# Patient Record
Sex: Female | Born: 1950
Health system: Southern US, Community
[De-identification: ages and names within clinical notes are randomized; demographics above are authoritative.]

## PROBLEM LIST (undated history)

## (undated) DIAGNOSIS — E785 Hyperlipidemia, unspecified: Secondary | ICD-10-CM

## (undated) DIAGNOSIS — E079 Disorder of thyroid, unspecified: Secondary | ICD-10-CM

## (undated) DIAGNOSIS — K219 Gastro-esophageal reflux disease without esophagitis: Secondary | ICD-10-CM

## (undated) DIAGNOSIS — E039 Hypothyroidism, unspecified: Secondary | ICD-10-CM

## (undated) DIAGNOSIS — Z8719 Personal history of other diseases of the digestive system: Secondary | ICD-10-CM

## (undated) DIAGNOSIS — M8580 Other specified disorders of bone density and structure, unspecified site: Secondary | ICD-10-CM

## (undated) HISTORY — DX: Personal history of other diseases of the digestive system: Z87.19

## (undated) HISTORY — DX: Other specified disorders of bone density and structure, unspecified site: M85.80

## (undated) HISTORY — DX: Gastro-esophageal reflux disease without esophagitis: K21.9

## (undated) HISTORY — DX: Disorder of thyroid, unspecified: E07.9

## (undated) HISTORY — DX: Hypothyroidism, unspecified: E03.9

## (undated) HISTORY — DX: Hyperlipidemia, unspecified: E78.5

---

## 1999-01-05 ENCOUNTER — Other Ambulatory Visit: Admission: RE | Admit: 1999-01-05 | Discharge: 1999-01-05 | Payer: Self-pay | Admitting: Obstetrics and Gynecology

## 2000-02-16 ENCOUNTER — Other Ambulatory Visit: Admission: RE | Admit: 2000-02-16 | Discharge: 2000-02-16 | Payer: Self-pay | Admitting: Obstetrics and Gynecology

## 2001-03-14 ENCOUNTER — Other Ambulatory Visit: Admission: RE | Admit: 2001-03-14 | Discharge: 2001-03-14 | Payer: Self-pay | Admitting: Obstetrics and Gynecology

## 2002-04-22 ENCOUNTER — Other Ambulatory Visit: Admission: RE | Admit: 2002-04-22 | Discharge: 2002-04-22 | Payer: Self-pay | Admitting: Obstetrics and Gynecology

## 2003-04-28 ENCOUNTER — Other Ambulatory Visit: Admission: RE | Admit: 2003-04-28 | Discharge: 2003-04-28 | Payer: Self-pay | Admitting: Obstetrics and Gynecology

## 2004-05-17 ENCOUNTER — Other Ambulatory Visit: Admission: RE | Admit: 2004-05-17 | Discharge: 2004-05-17 | Payer: Self-pay | Admitting: Obstetrics and Gynecology

## 2005-05-25 ENCOUNTER — Other Ambulatory Visit: Admission: RE | Admit: 2005-05-25 | Discharge: 2005-05-25 | Payer: Self-pay | Admitting: Obstetrics and Gynecology

## 2009-11-04 ENCOUNTER — Encounter: Admission: RE | Admit: 2009-11-04 | Discharge: 2009-11-04 | Payer: Self-pay | Admitting: Obstetrics and Gynecology

## 2010-03-31 ENCOUNTER — Ambulatory Visit (HOSPITAL_COMMUNITY): Payer: Self-pay | Admitting: Psychology

## 2012-05-08 ENCOUNTER — Other Ambulatory Visit: Payer: Self-pay | Admitting: Obstetrics and Gynecology

## 2012-05-08 DIAGNOSIS — R928 Other abnormal and inconclusive findings on diagnostic imaging of breast: Secondary | ICD-10-CM

## 2012-05-15 ENCOUNTER — Other Ambulatory Visit: Payer: Self-pay | Admitting: Obstetrics and Gynecology

## 2012-05-15 ENCOUNTER — Ambulatory Visit
Admission: RE | Admit: 2012-05-15 | Discharge: 2012-05-15 | Disposition: A | Discharge status: Home / Self Care | Payer: 59 | Source: Ambulatory Visit | Attending: Obstetrics and Gynecology | Admitting: Obstetrics and Gynecology

## 2012-05-15 DIAGNOSIS — R928 Other abnormal and inconclusive findings on diagnostic imaging of breast: Secondary | ICD-10-CM

## 2012-05-23 ENCOUNTER — Ambulatory Visit
Admission: RE | Admit: 2012-05-23 | Discharge: 2012-05-23 | Disposition: A | Discharge status: Home / Self Care | Payer: 59 | Source: Ambulatory Visit | Attending: Obstetrics and Gynecology | Admitting: Obstetrics and Gynecology

## 2012-05-23 ENCOUNTER — Other Ambulatory Visit: Payer: Self-pay | Admitting: Obstetrics and Gynecology

## 2012-05-23 DIAGNOSIS — R928 Other abnormal and inconclusive findings on diagnostic imaging of breast: Secondary | ICD-10-CM

## 2012-05-29 ENCOUNTER — Inpatient Hospital Stay: Admission: RE | Admit: 2012-05-29 | Payer: 59 | Source: Ambulatory Visit

## 2012-12-06 ENCOUNTER — Other Ambulatory Visit: Payer: Self-pay | Admitting: Obstetrics and Gynecology

## 2012-12-06 DIAGNOSIS — R921 Mammographic calcification found on diagnostic imaging of breast: Secondary | ICD-10-CM

## 2012-12-23 ENCOUNTER — Ambulatory Visit
Admission: RE | Admit: 2012-12-23 | Discharge: 2012-12-23 | Disposition: A | Discharge status: Home / Self Care | Payer: BC Managed Care – PPO | Source: Ambulatory Visit | Attending: Obstetrics and Gynecology | Admitting: Obstetrics and Gynecology

## 2012-12-23 DIAGNOSIS — R921 Mammographic calcification found on diagnostic imaging of breast: Secondary | ICD-10-CM

## 2013-05-06 ENCOUNTER — Other Ambulatory Visit: Payer: Self-pay | Admitting: Obstetrics and Gynecology

## 2013-05-12 ENCOUNTER — Other Ambulatory Visit: Payer: Self-pay | Admitting: Obstetrics and Gynecology

## 2013-05-12 DIAGNOSIS — R928 Other abnormal and inconclusive findings on diagnostic imaging of breast: Secondary | ICD-10-CM

## 2013-05-19 ENCOUNTER — Ambulatory Visit
Admission: RE | Admit: 2013-05-19 | Discharge: 2013-05-19 | Disposition: A | Discharge status: Home / Self Care | Payer: BC Managed Care – PPO | Source: Ambulatory Visit | Attending: Obstetrics and Gynecology | Admitting: Obstetrics and Gynecology

## 2013-05-19 DIAGNOSIS — R928 Other abnormal and inconclusive findings on diagnostic imaging of breast: Secondary | ICD-10-CM

## 2014-05-12 ENCOUNTER — Other Ambulatory Visit: Payer: Self-pay | Admitting: Obstetrics and Gynecology

## 2014-05-18 LAB — CYTOLOGY - PAP

## 2014-05-22 ENCOUNTER — Other Ambulatory Visit: Payer: Self-pay | Admitting: Obstetrics and Gynecology

## 2014-05-22 DIAGNOSIS — E2839 Other primary ovarian failure: Secondary | ICD-10-CM

## 2014-05-26 ENCOUNTER — Ambulatory Visit
Admission: RE | Admit: 2014-05-26 | Discharge: 2014-05-26 | Disposition: A | Discharge status: Home / Self Care | Payer: BC Managed Care – PPO | Source: Ambulatory Visit | Attending: Obstetrics and Gynecology | Admitting: Obstetrics and Gynecology

## 2014-05-26 DIAGNOSIS — E2839 Other primary ovarian failure: Secondary | ICD-10-CM

## 2014-05-26 LAB — HM DEXA SCAN

## 2014-05-27 ENCOUNTER — Other Ambulatory Visit: Payer: BC Managed Care – PPO

## 2014-08-03 ENCOUNTER — Ambulatory Visit (INDEPENDENT_AMBULATORY_CARE_PROVIDER_SITE_OTHER): Payer: 59 | Admitting: Physician Assistant

## 2014-08-03 ENCOUNTER — Encounter: Payer: Self-pay | Admitting: Physician Assistant

## 2014-08-03 VITALS — BP 127/68 | HR 78 | Temp 97.7°F | Ht 63.0 in | Wt 146.0 lb

## 2014-08-03 DIAGNOSIS — M810 Age-related osteoporosis without current pathological fracture: Secondary | ICD-10-CM

## 2014-08-03 DIAGNOSIS — E039 Hypothyroidism, unspecified: Secondary | ICD-10-CM | POA: Diagnosis not present

## 2014-08-03 DIAGNOSIS — H6981 Other specified disorders of Eustachian tube, right ear: Secondary | ICD-10-CM | POA: Diagnosis not present

## 2014-08-03 DIAGNOSIS — K219 Gastro-esophageal reflux disease without esophagitis: Secondary | ICD-10-CM

## 2014-08-03 MED ORDER — METHYLPREDNISOLONE (PAK) 4 MG PO TABS: ORAL_TABLET | ORAL | Status: DC

## 2014-08-03 NOTE — Patient Instructions (Signed)
flonase 2 sprays each nostril daily

## 2014-08-03 NOTE — Progress Notes (Signed)
   Subjective:    Patient ID: Tammy Humphrey, female    DOB: 1951/06/18, 64 y.o.   MRN: [id number]  HPI Pt is a 64 yo female who presents to the clinic to establish care.   .. Active Ambulatory Problems    Diagnosis Date Noted  . Esophageal reflux 08/03/2014  . Thyroid activity decreased 08/03/2014  . Osteoporosis 08/03/2014   Resolved Ambulatory Problems    Diagnosis Date Noted  . No Resolved Ambulatory Problems   Past Medical History  Diagnosis Date  . Thyroid disease    .Marland Kitchen Family History  Problem Relation Age of Onset  . Hypertension Mother   . Heart attack Father   . Stroke Father   . Heart attack Paternal Grandmother    .Marland Kitchen History   Social History  . Marital Status: Widowed    Spouse Name: N/A  . Number of Children: N/A  . Years of Education: N/A   Occupational History  . Not on file.   Social History Main Topics  . Smoking status: Never Smoker   . Smokeless tobacco: Not on file  . Alcohol Use: No  . Drug Use: No  . Sexual Activity: Not Currently   Other Topics Concern  . Not on file   Social History Narrative  . No narrative on file   Pt has had a cold and symptoms seemed to be improving except for right ear stuffiness and discomfort. While she had cold symptoms she tried mucinex and helped with some symptoms but not ear discomfort. No drainage. No fever, sinus pressure, cough, ST.    Review of Systems  All other systems reviewed and are negative.      Objective:   Physical Exam  Constitutional: She is oriented to person, place, and time. She appears well-developed and well-nourished.  HENT:  Head: Normocephalic and atraumatic.  Left Ear: External ear normal.  Nose: Nose normal.  Mouth/Throat: Oropharynx is clear and moist. No oropharyngeal exudate.  Right TM clear no signs of infection. Possible slight buldge due to fluid build up.   No sinus pressure to palpation.   Eyes: Conjunctivae are normal. Right eye exhibits no discharge. Left  eye exhibits no discharge.  Neck: Normal range of motion. Neck supple.  Cardiovascular: Normal rate, regular rhythm and normal heart sounds.   Pulmonary/Chest: Effort normal and breath sounds normal. She has no wheezes.  Lymphadenopathy:    She has no cervical adenopathy.  Neurological: She is alert and oriented to person, place, and time.  Skin: Skin is dry.  Psychiatric: She has a normal mood and affect. Her behavior is normal.          Assessment & Plan:  Eustachian ear dysfunction- treated with prednisone and flonase. Chew gum can help to decrease pressure. Follow up as needed. Reassured pt no signs of infection today.   Osteoporosis- on evista. Will get records on last bone density.   Thyroid activity decreased- on medication last labs in November.   GERD- controlled with protonix.

## 2014-08-07 ENCOUNTER — Encounter: Payer: Self-pay | Admitting: Physician Assistant

## 2014-08-07 DIAGNOSIS — D509 Iron deficiency anemia, unspecified: Secondary | ICD-10-CM | POA: Insufficient documentation

## 2014-08-07 DIAGNOSIS — E785 Hyperlipidemia, unspecified: Secondary | ICD-10-CM | POA: Insufficient documentation

## 2014-09-19 IMAGING — MG MM DIAGNOSTIC UNILATERAL R
3 series · 3 of 7 positions shown · non-contrast
Comparison: 05/23/2012, 05/15/2012 from [facility] [[facility]ED].

CLINICAL DATA: The patient returns for evaluation of a possible
area of distortion in the right subareolar region, noted on recent
screening mammogram from [HOSPITAL[facility] dated 05/06/2013.

EXAM:
DIGITAL DIAGNOSTIC  RIGHT MAMMOGRAM WITH CAD

[R CC]
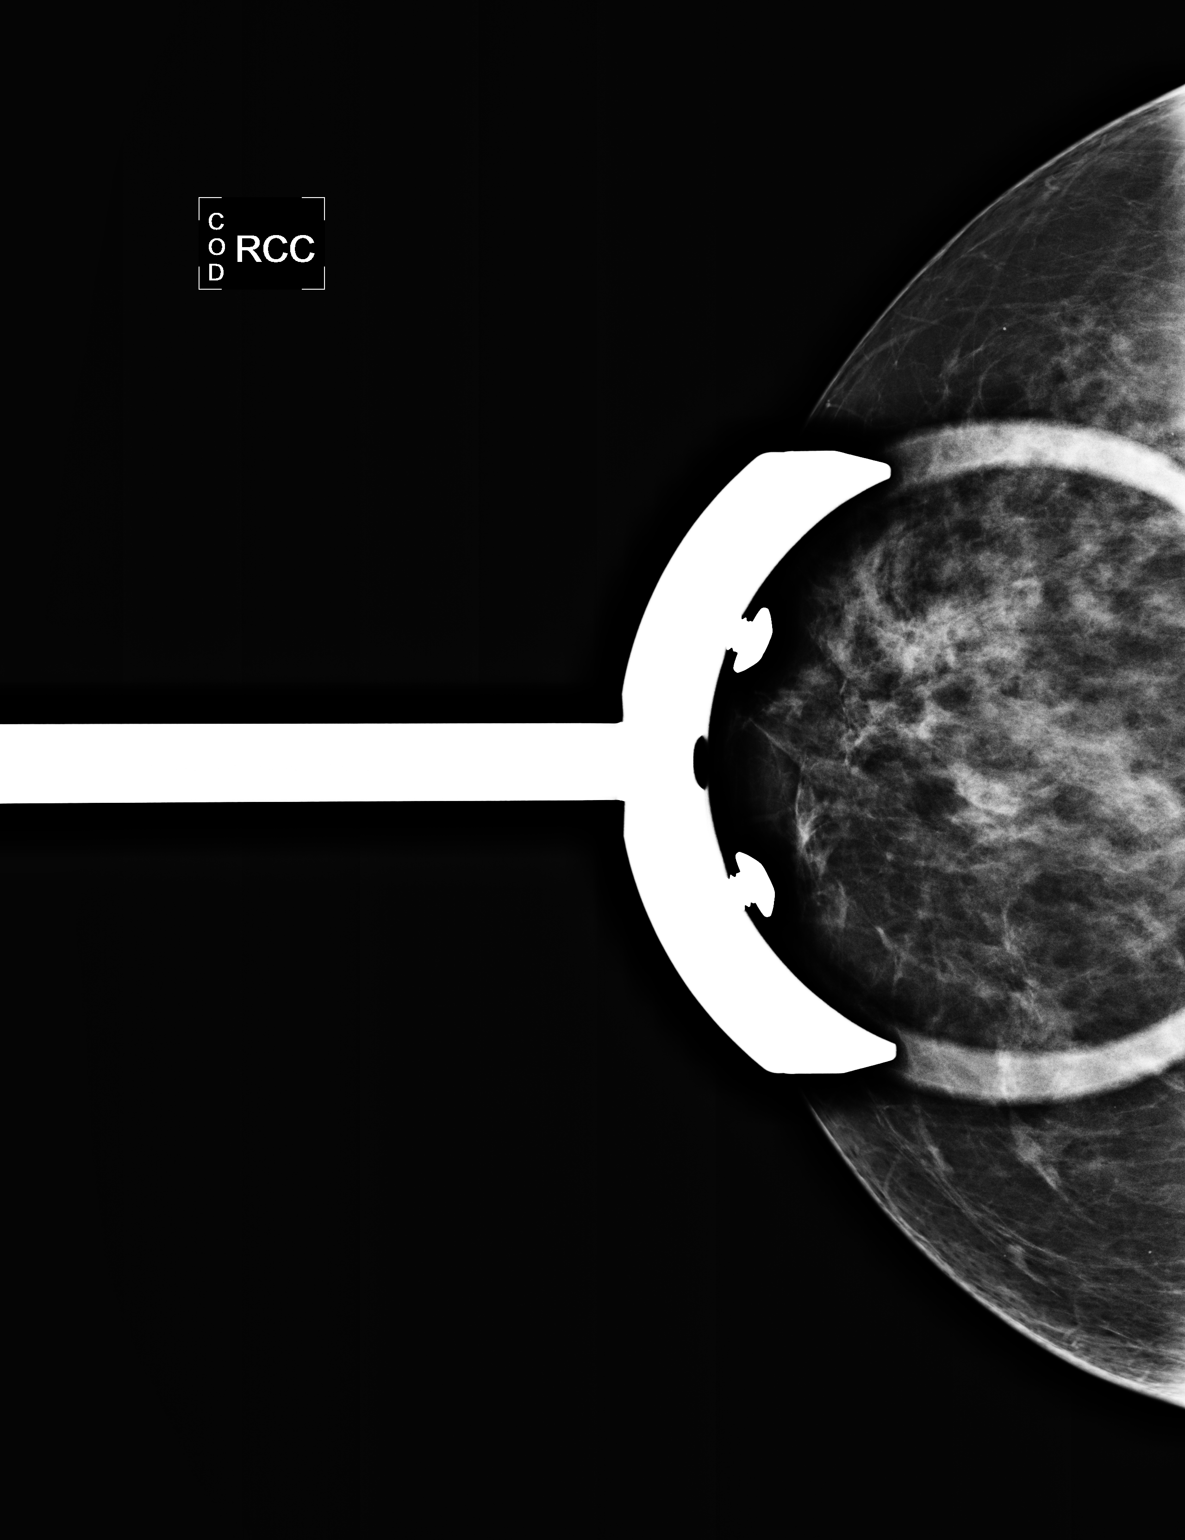

[R MLO]
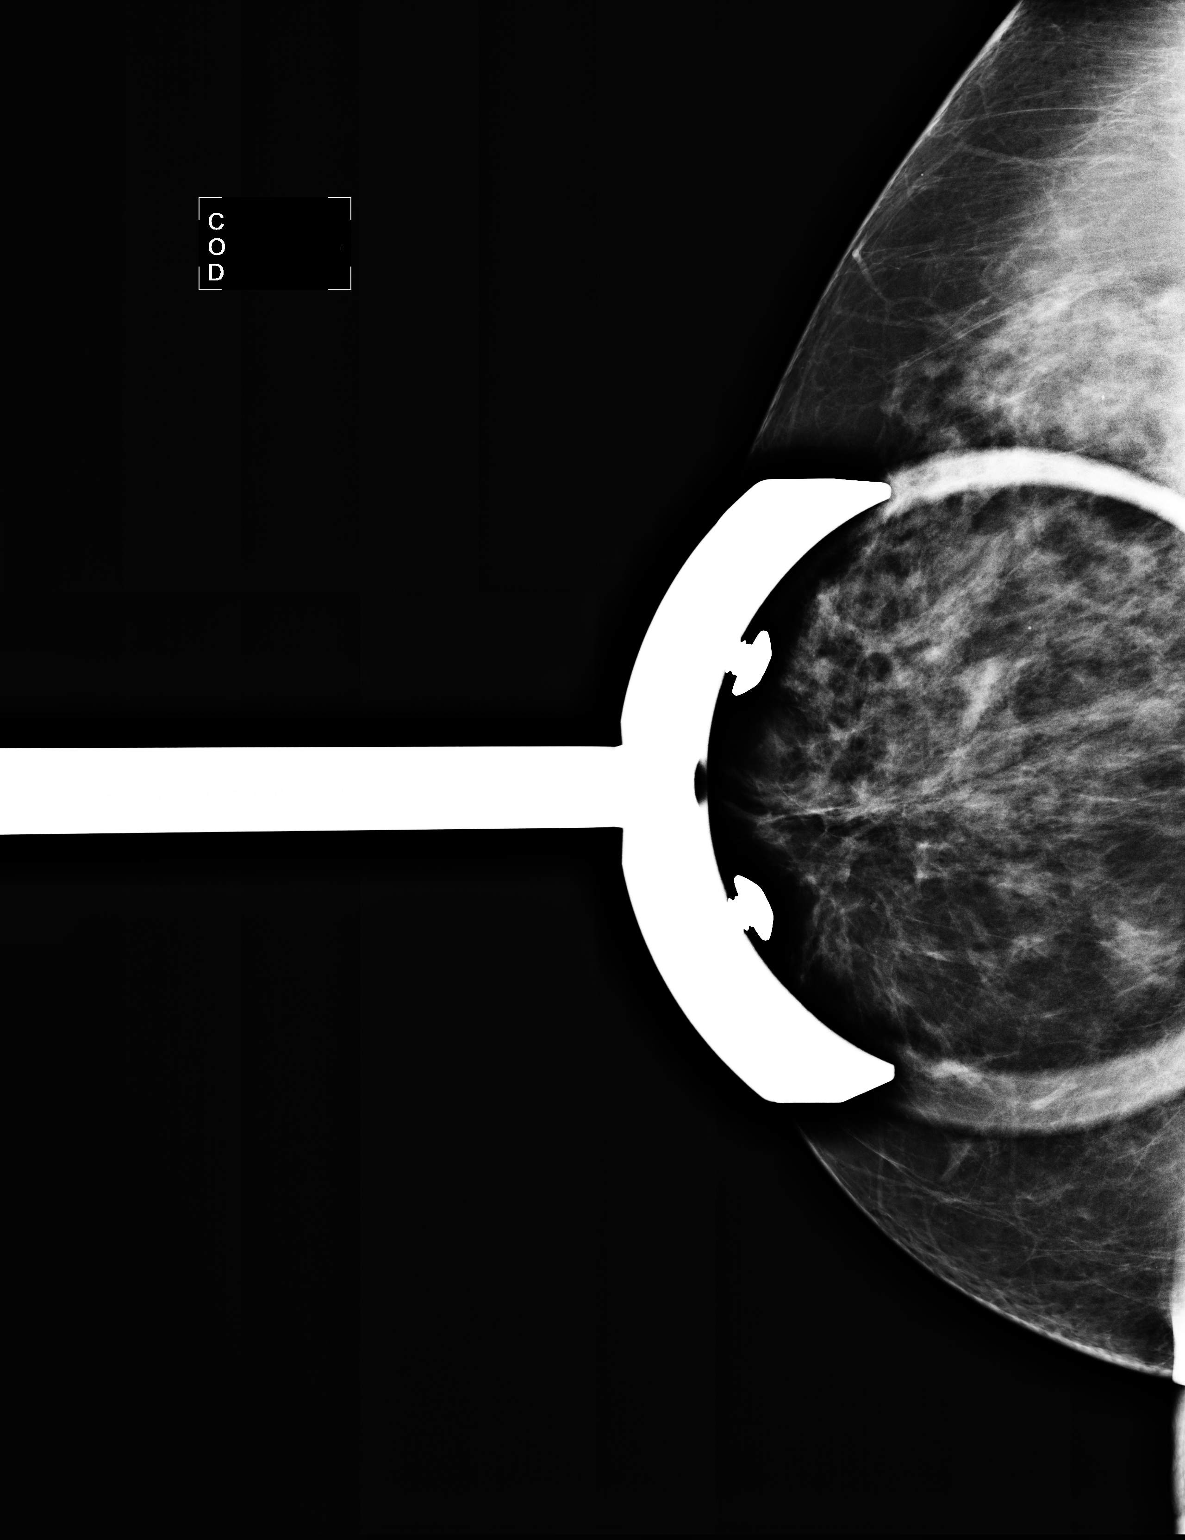

[R MLO tomo · tomo slice 47/92.0]
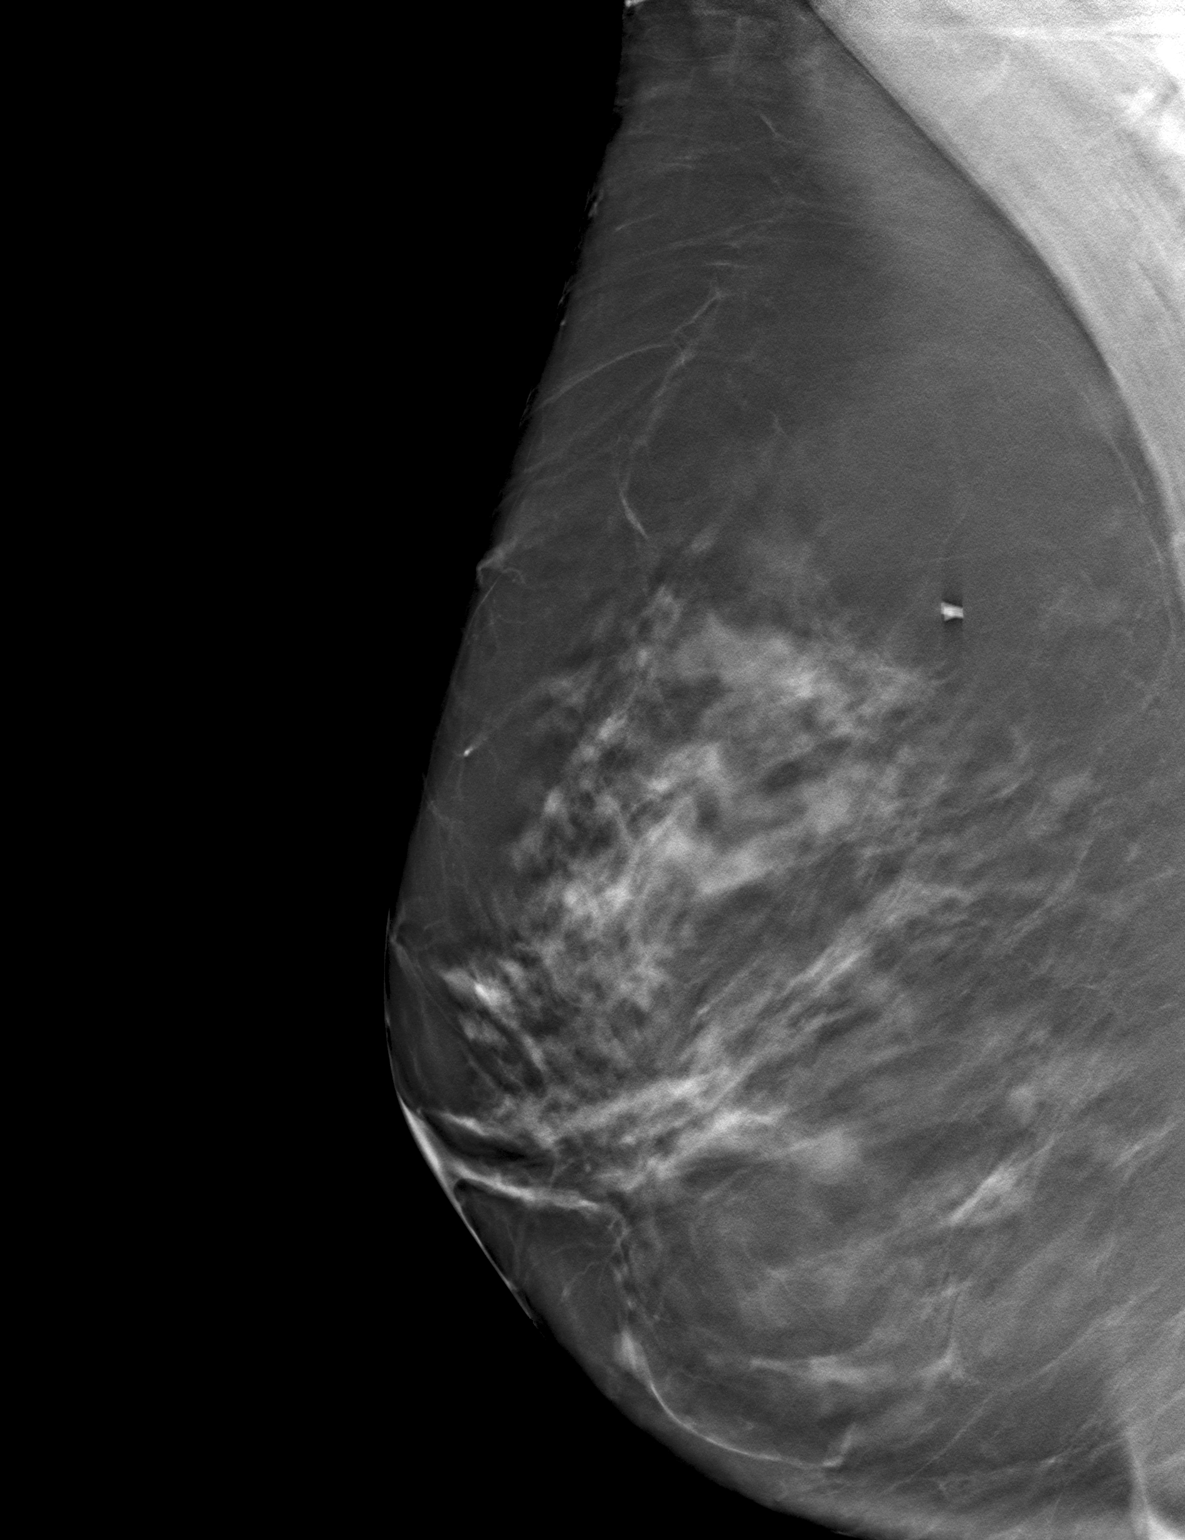

[3 of 7 positions shown; findings below may reference images not displayed]

05/02/2012, 03/06/2011, 11/02/2009 from [[facility]ED] OBG[facility].

ACR Breast Density Category c: The breasts are heterogeneously
dense, which may obscure small masses.
FINDINGS: Additional views demonstrate no persistent mass or distortion in the
right subareolar region.

Mammographic images were processed with CAD.
IMPRESSION: No persistent worrisome abnormality upon additional imaging of the
right breast.

RECOMMENDATION:
Yearly screening mammography is suggested.

I have discussed the findings and recommendations with the patient.
Results were also provided in writing at the conclusion of the
visit. If applicable, a reminder letter will be sent to the patient
regarding the next appointment.

BI-RADS CATEGORY  1: Negative

## 2015-03-10 ENCOUNTER — Telehealth: Payer: Self-pay | Admitting: *Deleted

## 2015-03-10 DIAGNOSIS — D229 Melanocytic nevi, unspecified: Secondary | ICD-10-CM

## 2015-03-10 NOTE — Telephone Encounter (Signed)
Referral placed for ins purposes.

## 2015-05-18 ENCOUNTER — Other Ambulatory Visit: Payer: Self-pay | Admitting: Obstetrics and Gynecology

## 2015-05-20 LAB — CYTOLOGY - PAP

## 2016-05-22 ENCOUNTER — Other Ambulatory Visit: Payer: Self-pay | Admitting: Obstetrics and Gynecology

## 2016-05-22 LAB — TSH: TSH: 1.77

## 2016-05-23 LAB — CYTOLOGY - PAP

## 2016-10-26 DIAGNOSIS — H25813 Combined forms of age-related cataract, bilateral: Secondary | ICD-10-CM | POA: Diagnosis not present

## 2016-12-01 DIAGNOSIS — H43813 Vitreous degeneration, bilateral: Secondary | ICD-10-CM | POA: Diagnosis not present

## 2016-12-01 DIAGNOSIS — H25013 Cortical age-related cataract, bilateral: Secondary | ICD-10-CM | POA: Diagnosis not present

## 2016-12-01 DIAGNOSIS — H524 Presbyopia: Secondary | ICD-10-CM | POA: Diagnosis not present

## 2016-12-01 DIAGNOSIS — H2513 Age-related nuclear cataract, bilateral: Secondary | ICD-10-CM | POA: Diagnosis not present

## 2016-12-18 DIAGNOSIS — D235 Other benign neoplasm of skin of trunk: Secondary | ICD-10-CM | POA: Diagnosis not present

## 2016-12-18 DIAGNOSIS — L821 Other seborrheic keratosis: Secondary | ICD-10-CM | POA: Diagnosis not present

## 2017-01-04 DIAGNOSIS — H2511 Age-related nuclear cataract, right eye: Secondary | ICD-10-CM | POA: Diagnosis not present

## 2017-01-04 DIAGNOSIS — H25011 Cortical age-related cataract, right eye: Secondary | ICD-10-CM | POA: Diagnosis not present

## 2017-01-04 DIAGNOSIS — H52221 Regular astigmatism, right eye: Secondary | ICD-10-CM | POA: Diagnosis not present

## 2017-01-04 DIAGNOSIS — H25811 Combined forms of age-related cataract, right eye: Secondary | ICD-10-CM | POA: Diagnosis not present

## 2017-02-08 DIAGNOSIS — H25812 Combined forms of age-related cataract, left eye: Secondary | ICD-10-CM | POA: Diagnosis not present

## 2017-02-08 DIAGNOSIS — H25012 Cortical age-related cataract, left eye: Secondary | ICD-10-CM | POA: Diagnosis not present

## 2017-02-08 DIAGNOSIS — H2512 Age-related nuclear cataract, left eye: Secondary | ICD-10-CM | POA: Diagnosis not present

## 2017-02-12 DIAGNOSIS — L821 Other seborrheic keratosis: Secondary | ICD-10-CM | POA: Diagnosis not present

## 2017-02-12 DIAGNOSIS — L570 Actinic keratosis: Secondary | ICD-10-CM | POA: Diagnosis not present

## 2017-02-12 DIAGNOSIS — D485 Neoplasm of uncertain behavior of skin: Secondary | ICD-10-CM | POA: Diagnosis not present

## 2017-05-16 DIAGNOSIS — H25013 Cortical age-related cataract, bilateral: Secondary | ICD-10-CM | POA: Diagnosis not present

## 2017-05-16 DIAGNOSIS — H2513 Age-related nuclear cataract, bilateral: Secondary | ICD-10-CM | POA: Diagnosis not present

## 2017-07-12 DIAGNOSIS — E039 Hypothyroidism, unspecified: Secondary | ICD-10-CM | POA: Diagnosis not present

## 2017-07-12 DIAGNOSIS — Z01419 Encounter for gynecological examination (general) (routine) without abnormal findings: Secondary | ICD-10-CM | POA: Diagnosis not present

## 2017-07-12 DIAGNOSIS — Z1231 Encounter for screening mammogram for malignant neoplasm of breast: Secondary | ICD-10-CM | POA: Diagnosis not present

## 2017-07-12 LAB — T4, FREE: Free T4: 1.54

## 2017-07-12 LAB — TSH: TSH: 2.17 (ref 0.41–5.90)

## 2018-03-04 ENCOUNTER — Ambulatory Visit (INDEPENDENT_AMBULATORY_CARE_PROVIDER_SITE_OTHER): Payer: PPO | Admitting: Physician Assistant

## 2018-03-04 ENCOUNTER — Encounter: Payer: Self-pay | Admitting: Physician Assistant

## 2018-03-04 VITALS — BP 104/77 | HR 87 | Ht 63.0 in | Wt 133.0 lb

## 2018-03-04 DIAGNOSIS — E782 Mixed hyperlipidemia: Secondary | ICD-10-CM

## 2018-03-04 DIAGNOSIS — Z23 Encounter for immunization: Secondary | ICD-10-CM

## 2018-03-04 DIAGNOSIS — Z0000 Encounter for general adult medical examination without abnormal findings: Secondary | ICD-10-CM

## 2018-03-04 DIAGNOSIS — E039 Hypothyroidism, unspecified: Secondary | ICD-10-CM

## 2018-03-04 DIAGNOSIS — M810 Age-related osteoporosis without current pathological fracture: Secondary | ICD-10-CM

## 2018-03-04 DIAGNOSIS — Z131 Encounter for screening for diabetes mellitus: Secondary | ICD-10-CM | POA: Diagnosis not present

## 2018-03-04 DIAGNOSIS — Z1159 Encounter for screening for other viral diseases: Secondary | ICD-10-CM | POA: Diagnosis not present

## 2018-03-04 DIAGNOSIS — T148XXA Other injury of unspecified body region, initial encounter: Secondary | ICD-10-CM | POA: Diagnosis not present

## 2018-03-04 MED ORDER — AMBULATORY NON FORMULARY MEDICATION: 0 refills | Status: DC

## 2018-03-04 NOTE — Patient Instructions (Signed)
Health Maintenance for Postmenopausal Women Menopause is a normal process in which your reproductive ability comes to an end. This process happens gradually over a span of months to years, usually between the ages of 22 and 9. Menopause is complete when you have missed 12 consecutive menstrual periods. It is important to talk with your health care provider about some of the most common conditions that affect postmenopausal women, such as heart disease, cancer, and bone loss (osteoporosis). Adopting a healthy lifestyle and getting preventive care can help to promote your health and wellness. Those actions can also lower your chances of developing some of these common conditions. What should I know about menopause? During menopause, you may experience a number of symptoms, such as:  Moderate-to-severe hot flashes.  Night sweats.  Decrease in sex drive.  Mood swings.  Headaches.  Tiredness.  Irritability.  Memory problems.  Insomnia.  Choosing to treat or not to treat menopausal changes is an individual decision that you make with your health care provider. What should I know about hormone replacement therapy and supplements? Hormone therapy products are effective for treating symptoms that are associated with menopause, such as hot flashes and night sweats. Hormone replacement carries certain risks, especially as you become older. If you are thinking about using estrogen or estrogen with progestin treatments, discuss the benefits and risks with your health care provider. What should I know about heart disease and stroke? Heart disease, heart attack, and stroke become more likely as you age. This may be due, in part, to the hormonal changes that your body experiences during menopause. These can affect how your body processes dietary fats, triglycerides, and cholesterol. Heart attack and stroke are both medical emergencies. There are many things that you can do to help prevent heart disease  and stroke:  Have your blood pressure checked at least every 1-2 years. High blood pressure causes heart disease and increases the risk of stroke.  If you are 53-22 years old, ask your health care provider if you should take aspirin to prevent a heart attack or a stroke.  Do not use any tobacco products, including cigarettes, chewing tobacco, or electronic cigarettes. If you need help quitting, ask your health care provider.  It is important to eat a healthy diet and maintain a healthy weight. ? Be sure to include plenty of vegetables, fruits, low-fat dairy products, and lean protein. ? Avoid eating foods that are high in solid fats, added sugars, or salt (sodium).  Get regular exercise. This is one of the most important things that you can do for your health. ? Try to exercise for at least 150 minutes each week. The type of exercise that you do should increase your heart rate and make you sweat. This is known as moderate-intensity exercise. ? Try to do strengthening exercises at least twice each week. Do these in addition to the moderate-intensity exercise.  Know your numbers.Ask your health care provider to check your cholesterol and your blood glucose. Continue to have your blood tested as directed by your health care provider.  What should I know about cancer screening? There are several types of cancer. Take the following steps to reduce your risk and to catch any cancer development as early as possible. Breast Cancer  Practice breast self-awareness. ? This means understanding how your breasts normally appear and feel. ? It also means doing regular breast self-exams. Let your health care provider know about any changes, no matter how small.  If you are 40  or older, have a clinician do a breast exam (clinical breast exam or CBE) every year. Depending on your age, family history, and medical history, it may be recommended that you also have a yearly breast X-ray (mammogram).  If you  have a family history of breast cancer, talk with your health care provider about genetic screening.  If you are at high risk for breast cancer, talk with your health care provider about having an MRI and a mammogram every year.  Breast cancer (BRCA) gene test is recommended for women who have family members with BRCA-related cancers. Results of the assessment will determine the need for genetic counseling and BRCA1 and for BRCA2 testing. BRCA-related cancers include these types: ? Breast. This occurs in males or females. ? Ovarian. ? Tubal. This may also be called fallopian tube cancer. ? Cancer of the abdominal or pelvic lining (peritoneal cancer). ? Prostate. ? Pancreatic.  Cervical, Uterine, and Ovarian Cancer Your health care provider may recommend that you be screened regularly for cancer of the pelvic organs. These include your ovaries, uterus, and vagina. This screening involves a pelvic exam, which includes checking for microscopic changes to the surface of your cervix (Pap test).  For women ages 21-65, health care providers may recommend a pelvic exam and a Pap test every three years. For women ages 79-65, they may recommend the Pap test and pelvic exam, combined with testing for human papilloma virus (HPV), every five years. Some types of HPV increase your risk of cervical cancer. Testing for HPV may also be done on women of any age who have unclear Pap test results.  Other health care providers may not recommend any screening for nonpregnant women who are considered low risk for pelvic cancer and have no symptoms. Ask your health care provider if a screening pelvic exam is right for you.  If you have had past treatment for cervical cancer or a condition that could lead to cancer, you need Pap tests and screening for cancer for at least 20 years after your treatment. If Pap tests have been discontinued for you, your risk factors (such as having a new sexual partner) need to be  reassessed to determine if you should start having screenings again. Some women have medical problems that increase the chance of getting cervical cancer. In these cases, your health care provider may recommend that you have screening and Pap tests more often.  If you have a family history of uterine cancer or ovarian cancer, talk with your health care provider about genetic screening.  If you have vaginal bleeding after reaching menopause, tell your health care provider.  There are currently no reliable tests available to screen for ovarian cancer.  Lung Cancer Lung cancer screening is recommended for adults 69-62 years old who are at high risk for lung cancer because of a history of smoking. A yearly low-dose CT scan of the lungs is recommended if you:  Currently smoke.  Have a history of at least 30 pack-years of smoking and you currently smoke or have quit within the past 15 years. A pack-year is smoking an average of one pack of cigarettes per day for one year.  Yearly screening should:  Continue until it has been 15 years since you quit.  Stop if you develop a health problem that would prevent you from having lung cancer treatment.  Colorectal Cancer  This type of cancer can be detected and can often be prevented.  Routine colorectal cancer screening usually begins at  age 42 and continues through age 45.  If you have risk factors for colon cancer, your health care provider may recommend that you be screened at an earlier age.  If you have a family history of colorectal cancer, talk with your health care provider about genetic screening.  Your health care provider may also recommend using home test kits to check for hidden blood in your stool.  A small camera at the end of a tube can be used to examine your colon directly (sigmoidoscopy or colonoscopy). This is done to check for the earliest forms of colorectal cancer.  Direct examination of the colon should be repeated every  5-10 years until age 71. However, if early forms of precancerous polyps or small growths are found or if you have a family history or genetic risk for colorectal cancer, you may need to be screened more often.  Skin Cancer  Check your skin from head to toe regularly.  Monitor any moles. Be sure to tell your health care provider: ? About any new moles or changes in moles, especially if there is a change in a mole's shape or color. ? If you have a mole that is larger than the size of a pencil eraser.  If any of your family members has a history of skin cancer, especially at a young age, talk with your health care provider about genetic screening.  Always use sunscreen. Apply sunscreen liberally and repeatedly throughout the day.  Whenever you are outside, protect yourself by wearing long sleeves, pants, a wide-brimmed hat, and sunglasses.  What should I know about osteoporosis? Osteoporosis is a condition in which bone destruction happens more quickly than new bone creation. After menopause, you may be at an increased risk for osteoporosis. To help prevent osteoporosis or the bone fractures that can happen because of osteoporosis, the following is recommended:  If you are 46-71 years old, get at least 1,000 mg of calcium and at least 600 mg of vitamin D per day.  If you are older than age 55 but younger than age 65, get at least 1,200 mg of calcium and at least 600 mg of vitamin D per day.  If you are older than age 54, get at least 1,200 mg of calcium and at least 800 mg of vitamin D per day.  Smoking and excessive alcohol intake increase the risk of osteoporosis. Eat foods that are rich in calcium and vitamin D, and do weight-bearing exercises several times each week as directed by your health care provider. What should I know about how menopause affects my mental health? Depression may occur at any age, but it is more common as you become older. Common symptoms of depression  include:  Low or sad mood.  Changes in sleep patterns.  Changes in appetite or eating patterns.  Feeling an overall lack of motivation or enjoyment of activities that you previously enjoyed.  Frequent crying spells.  Talk with your health care provider if you think that you are experiencing depression. What should I know about immunizations? It is important that you get and maintain your immunizations. These include:  Tetanus, diphtheria, and pertussis (Tdap) booster vaccine.  Influenza every year before the flu season begins.  Pneumonia vaccine.  Shingles vaccine.  Your health care provider may also recommend other immunizations. This information is not intended to replace advice given to you by your health care provider. Make sure you discuss any questions you have with your health care provider. Document Released: 07/28/2005  Document Revised: 12/24/2015 Document Reviewed: 03/09/2015 Elsevier Interactive Patient Education  2018 Elsevier Inc.  

## 2018-03-04 NOTE — Progress Notes (Signed)
Subjective:    Patient ID: Tammy Humphrey, female    DOB: 05-11-1951, 67 y.o.   MRN: [id number]  HPI  Pt is a 67 yo female with IDA, HLD, osteoporosis, hypothyroidism, GERD who presents today for CPE.   She goes to GYN for mammogram.   Pt has osteoporosis but not taking medication. She has not had a bone density in a while. She does take vitamin D and calcium. She does light weight lifting and 2-3 miles of cardio a day.   .. Active Ambulatory Problems    Diagnosis Date Noted  . Esophageal reflux 08/03/2014  . Thyroid activity decreased 08/03/2014  . Osteoporosis 08/03/2014  . Hyperlipidemia 08/07/2014  . IDA (iron deficiency anemia) 08/07/2014  . Skin abrasion 03/04/2018   Resolved Ambulatory Problems    Diagnosis Date Noted  . No Resolved Ambulatory Problems   Past Medical History:  Diagnosis Date  . Thyroid disease    .Marland Kitchen Family History  Problem Relation Age of Onset  . Hypertension Mother   . Heart attack Father   . Stroke Father   . Heart attack Paternal Grandmother    .Marland Kitchen Social History   Socioeconomic History  . Marital status: Widowed    Spouse name: Not on file  . Number of children: Not on file  . Years of education: Not on file  . Highest education level: Not on file  Occupational History  . Not on file  Social Needs  . Financial resource strain: Not on file  . Food insecurity:    Worry: Not on file    Inability: Not on file  . Transportation needs:    Medical: Not on file    Non-medical: Not on file  Tobacco Use  . Smoking status: Never Smoker  . Smokeless tobacco: Never Used  Substance and Sexual Activity  . Alcohol use: No    Alcohol/week: 0.0 standard drinks  . Drug use: No  . Sexual activity: Not Currently  Lifestyle  . Physical activity:    Days per week: Not on file    Minutes per session: Not on file  . Stress: Not on file  Relationships  . Social connections:    Talks on phone: Not on file    Gets together: Not on file   Attends religious service: Not on file    Active member of club or organization: Not on file    Attends meetings of clubs or organizations: Not on file    Relationship status: Not on file  . Intimate partner violence:    Fear of current or ex partner: Not on file    Emotionally abused: Not on file    Physically abused: Not on file    Forced sexual activity: Not on file  Other Topics Concern  . Not on file  Social History Narrative  . Not on file     Review of Systems  All other systems reviewed and are negative.      Objective:   Physical Exam  Constitutional: She is oriented to person, place, and time. She appears well-developed and well-nourished.  HENT:  Head: Normocephalic and atraumatic.  Right Ear: External ear normal.  Left Ear: External ear normal.  Nose: Nose normal.  Mouth/Throat: Oropharynx is clear and moist. No oropharyngeal exudate.  Eyes: Pupils are equal, round, and reactive to light. Conjunctivae and EOM are normal. Right eye exhibits no discharge. Left eye exhibits no discharge.  Neck: Normal range of motion. Neck supple.  Cardiovascular: Normal  rate, regular rhythm and normal heart sounds.  No murmur heard. Pulmonary/Chest: Effort normal and breath sounds normal. She has no wheezes.  Abdominal: Soft. Bowel sounds are normal.  Musculoskeletal: Normal range of motion.  Lymphadenopathy:    She has no cervical adenopathy.  Neurological: She is alert and oriented to person, place, and time.  Skin: No rash noted.  Abrasions on arms due to gardening.   Psychiatric: She has a normal mood and affect. Her behavior is normal.          Assessment & Plan:  Marland KitchenMarland KitchenDiagnoses and all orders for this visit:  Routine physical examination -     Hepatitis C Antibody -     Lipid Panel w/reflex Direct LDL -     COMPLETE METABOLIC PANEL WITH GFR -     TSH  Age-related osteoporosis without current pathological fracture  Mixed hyperlipidemia -     Lipid Panel  w/reflex Direct LDL  Hypothyroidism, unspecified type -     TSH  Need for hepatitis C screening test -     Hepatitis C Antibody  Screening for diabetes mellitus -     COMPLETE METABOLIC PANEL WITH GFR  Skin abrasion  Need for Tdap vaccination -     Tdap vaccine greater than or equal to 7yo IM  Need for shingles vaccine -     AMBULATORY NON FORMULARY MEDICATION; shingrx 2 doses to prevent shingles.   .. Depression screen [facility] 2/9 03/04/2018  Decreased Interest 0  Down, Depressed, Hopeless 0  PHQ - 2 Score 0  Altered sleeping 0  Tired, decreased energy 0  Change in appetite 0  Feeling bad or failure about yourself  0  Trouble concentrating 0  Moving slowly or fidgety/restless 0  Suicidal thoughts 0  PHQ-9 Score 0  Difficult doing work/chores Not difficult at all   .Marland Kitchen Discussed 150 minutes of exercise a week.  Encouraged vitamin D 1000 units and Calcium 1300mg  or 4 servings of dairy a day.  Fasting labs ordered.  Will call to get mammogram for [place].  Ordered bone density.  Ordered cologuard.  Tdap given.  shingrix vaccine given to have done.

## 2018-03-05 LAB — LIPID PANEL W/REFLEX DIRECT LDL
CHOL/HDL RATIO: 2.5 (calc) (ref <5.0)
CHOLESTEROL: 235 mg/dL — AB (ref <200)
HDL: 93 mg/dL (ref >50)
LDL Cholesterol (Calc): 124 mg/dL (calc) — ABNORMAL HIGH
NON-HDL CHOLESTEROL (CALC): 142 mg/dL — AB (ref <130)
TRIGLYCERIDES: 83 mg/dL (ref <150)

## 2018-03-05 LAB — COMPLETE METABOLIC PANEL WITH GFR
AG RATIO: 2.1 (calc) (ref 1.0–2.5)
ALT: 11 U/L (ref 6–29)
AST: 15 U/L (ref 10–35)
Albumin: 4.4 g/dL (ref 3.6–5.1)
Alkaline phosphatase (APISO): 62 U/L (ref 33–130)
BUN/Creatinine Ratio: 21 (calc) (ref 6–22)
BUN: 21 mg/dL (ref 7–25)
CALCIUM: 9.7 mg/dL (ref 8.6–10.4)
CHLORIDE: 106 mmol/L (ref 98–110)
CO2: 29 mmol/L (ref 20–32)
Creat: 1.01 mg/dL — ABNORMAL HIGH (ref 0.50–0.99)
GFR, EST AFRICAN AMERICAN: 67 mL/min/{1.73_m2} (ref >=60)
GFR, EST NON AFRICAN AMERICAN: 58 mL/min/{1.73_m2} — AB (ref >=60)
GLOBULIN: 2.1 g/dL (ref 1.9–3.7)
Glucose, Bld: 98 mg/dL (ref 65–99)
POTASSIUM: 4.6 mmol/L (ref 3.5–5.3)
Sodium: 142 mmol/L (ref 135–146)
Total Bilirubin: 1.5 mg/dL — ABNORMAL HIGH (ref 0.2–1.2)
Total Protein: 6.5 g/dL (ref 6.1–8.1)

## 2018-03-05 LAB — HEPATITIS C ANTIBODY
Hepatitis C Ab: NONREACTIVE
SIGNAL TO CUT-OFF: 0.01 (ref <1.00)

## 2018-03-05 LAB — TSH: TSH: 0.9 mIU/L (ref 0.40–4.50)

## 2018-03-05 NOTE — Progress Notes (Signed)
Call pt: HDL great. LDL good. Not quite to goal but with no risk factors ok to still work on diet. CV risk still low at 3.7 percent in 10 years.  Kidney function just a hair elevated. I just would avoid any oral antiinflammatories for now. Not concerning but certainly will keep our eye on it. Thyroid looks good.

## 2018-03-12 ENCOUNTER — Encounter: Payer: Self-pay | Admitting: Physician Assistant

## 2018-03-12 DIAGNOSIS — Z1211 Encounter for screening for malignant neoplasm of colon: Secondary | ICD-10-CM | POA: Diagnosis not present

## 2018-03-12 DIAGNOSIS — Z1212 Encounter for screening for malignant neoplasm of rectum: Secondary | ICD-10-CM | POA: Diagnosis not present

## 2018-03-12 LAB — COLOGUARD

## 2018-03-15 ENCOUNTER — Telehealth: Payer: Self-pay

## 2018-03-15 DIAGNOSIS — Z13820 Encounter for screening for osteoporosis: Secondary | ICD-10-CM

## 2018-03-15 DIAGNOSIS — Z780 Asymptomatic menopausal state: Secondary | ICD-10-CM

## 2018-03-15 NOTE — Telephone Encounter (Signed)
Tammy Humphrey called today saying that she would like to have a bone density scan done. She said you both had talked about it at her last visit, but she forgot to mention it to you again before she left. She would prefer to have it done at [place] in [facility] if at all possible. Just wanted to let you know. Thanks!

## 2018-03-16 NOTE — Telephone Encounter (Signed)
I made this one but you can always order screening/preventative health items. Bone density external with preferred imaging location with dx code of osteoporosis screening/post menopausal

## 2018-04-04 DIAGNOSIS — H26491 Other secondary cataract, right eye: Secondary | ICD-10-CM | POA: Diagnosis not present

## 2018-06-26 DIAGNOSIS — L570 Actinic keratosis: Secondary | ICD-10-CM | POA: Diagnosis not present

## 2018-06-26 DIAGNOSIS — X32XXXS Exposure to sunlight, sequela: Secondary | ICD-10-CM | POA: Diagnosis not present

## 2018-06-26 DIAGNOSIS — L821 Other seborrheic keratosis: Secondary | ICD-10-CM | POA: Diagnosis not present

## 2018-06-26 DIAGNOSIS — L814 Other melanin hyperpigmentation: Secondary | ICD-10-CM | POA: Diagnosis not present

## 2018-07-23 ENCOUNTER — Other Ambulatory Visit: Payer: Self-pay

## 2018-07-23 MED ORDER — LEVOTHYROXINE SODIUM 75 MCG PO TABS: 75.0000 ug | ORAL_TABLET | Freq: Every day | ORAL | 1 refills | Status: DC

## 2018-07-23 NOTE — Telephone Encounter (Signed)
Maurya requests a refill on Levothyroxine 75 mcg to be sent to [facility]. Historical provider.

## 2018-08-26 DIAGNOSIS — Z01419 Encounter for gynecological examination (general) (routine) without abnormal findings: Secondary | ICD-10-CM | POA: Diagnosis not present

## 2018-08-26 DIAGNOSIS — Z1231 Encounter for screening mammogram for malignant neoplasm of breast: Secondary | ICD-10-CM | POA: Diagnosis not present

## 2018-10-21 ENCOUNTER — Telehealth: Payer: Self-pay | Admitting: Neurology

## 2018-10-21 DIAGNOSIS — E039 Hypothyroidism, unspecified: Secondary | ICD-10-CM

## 2018-10-21 NOTE — Telephone Encounter (Signed)
Received note from [place] [facility] asking if we can change patient's Levothyroxine to [employer]. Please advise.

## 2018-10-21 NOTE — Telephone Encounter (Signed)
We can but need to let patient know that it has changed and to recheck labs in 4-6 weeks as numbers can be effected.

## 2018-10-22 NOTE — Telephone Encounter (Signed)
Okay faxed to pharmacy at [phone] with confirmation received.

## 2018-10-22 NOTE — Telephone Encounter (Signed)
Tried to call patient to make her aware need for labs with no answer.

## 2018-10-22 NOTE — Telephone Encounter (Signed)
Patient aware need for labs. Lab ordered and she will have drawn in 6 weeks.

## 2018-12-05 DIAGNOSIS — E039 Hypothyroidism, unspecified: Secondary | ICD-10-CM | POA: Diagnosis not present

## 2018-12-05 LAB — TSH: TSH: 1.73 mIU/L (ref 0.40–4.50)

## 2018-12-09 ENCOUNTER — Other Ambulatory Visit: Payer: Self-pay | Admitting: Neurology

## 2018-12-09 MED ORDER — LEVOTHYROXINE SODIUM 75 MCG PO TABS: 75.0000 ug | ORAL_TABLET | Freq: Every day | ORAL | 3 refills | Status: DC

## 2018-12-09 NOTE — Telephone Encounter (Signed)
TSH is perfect. [place] for levothyroxine refill for 1 year.

## 2018-12-11 ENCOUNTER — Other Ambulatory Visit: Payer: Self-pay | Admitting: Physician Assistant

## 2018-12-30 DIAGNOSIS — L820 Inflamed seborrheic keratosis: Secondary | ICD-10-CM | POA: Diagnosis not present

## 2018-12-30 DIAGNOSIS — L218 Other seborrheic dermatitis: Secondary | ICD-10-CM | POA: Diagnosis not present

## 2018-12-30 DIAGNOSIS — D485 Neoplasm of uncertain behavior of skin: Secondary | ICD-10-CM | POA: Diagnosis not present

## 2018-12-30 DIAGNOSIS — L814 Other melanin hyperpigmentation: Secondary | ICD-10-CM | POA: Diagnosis not present

## 2018-12-30 DIAGNOSIS — L821 Other seborrheic keratosis: Secondary | ICD-10-CM | POA: Diagnosis not present

## 2018-12-30 DIAGNOSIS — X32XXXS Exposure to sunlight, sequela: Secondary | ICD-10-CM | POA: Diagnosis not present

## 2018-12-30 DIAGNOSIS — L570 Actinic keratosis: Secondary | ICD-10-CM | POA: Diagnosis not present

## 2018-12-30 DIAGNOSIS — L718 Other rosacea: Secondary | ICD-10-CM | POA: Diagnosis not present

## 2019-01-13 NOTE — Progress Notes (Addendum)
Subjective:   Tammy Humphrey is a 68 y.o. female who presents for an Initial Medicare Annual Wellness Visit.  Review of Systems    No ROS.  Medicare Wellness Virtual Visit.  Visual/audio telehealth visit, UTA vital signs.   See social history for additional risk factors.     Cardiac Risk Factors include: advanced age (>18men, >58 women);dyslipidemia Sleep patterns: Getting 7 hours of sleep a night. Does not wake up during night to void. Wakes up and feels sluggish    Home Safety/Smoke Alarms: Feels safe in home. Smoke alarms in place.  Living environment; Lives alone but grandkids are staying with her right now. Lives in a 1 story home. Stairs have railings on them. Shower is a walk in shower and grab bars a re in place. Seat Belt Safety/Bike Helmet: Wears seat belt.   Female:   Pap-  Aged out     [place] in February at [place] scan- ordered while in for visit today       CCS- UTD      Objective:    Today's Vitals   01/14/19 0828  BP: 102/69  Pulse: 73  Temp: 98.2 F (36.8 C)  TempSrc: Oral  SpO2: 97%  Weight: 137 lb (62.1 kg)  Height: 5\' 3"  (1.6 m)   Body mass index is 24.27 kg/m.  Advanced Directives 01/14/2019 08/03/2014  Does Patient Have a Medical Advance Directive? No Yes;No  Does patient want to make changes to medical advance directive? - No - Patient declined  Would patient like information on creating a medical advance directive? No - Patient declined -    Current Medications (verified) Outpatient Encounter Medications as of 01/14/2019  Medication Sig  . EUTHYROX 75 MCG tablet TAKE 1 TABLET BY MOUTH ONCE DAILY BEFORE BREAKFAST  . Influenza vac split quadrivalent PF (FLUZONE HIGH-DOSE) 0.5 ML injection Fluzone High-Dose 2018-2019 (PF) 180 mcg/0.5 mL intramuscular syringe  TO BE ADMINISTERED BY PHARMACIST FOR IMMUNIZATION  . pneumococcal 13-valent conjugate vaccine (PREVNAR 13) SUSP injection Prevnar 13 (PF) 0.5 mL intramuscular  syringe  inject 0.5 milliliter intramuscularly  . pneumococcal 23 valent vaccine (PNEUMOVAX 23) 25 MCG/0.5ML injection Pneumovax 23 25 mcg/0.5 mL injection syringe  inject 0.5 milliliter intramuscularly  . Zoster Vaccine Live, PF, (ZOSTAVAX) 02637 UNT/0.65ML injection Zostavax (PF) 19,400 unit/0.65 mL subcutaneous suspension  ADM 0.65ML [facility]ector UTD  . AMBULATORY NON FORMULARY MEDICATION shingrx 2 doses to prevent shingles. (Patient not taking: Reported on 01/14/2019)   No facility-administered encounter medications on file as of 01/14/2019.     Allergies (verified) Patient has no known allergies.   History: Past Medical History:  Diagnosis Date  . Thyroid disease    History reviewed. No pertinent surgical history. Family History  Problem Relation Age of Onset  . Hypertension Mother   . Heart attack Father   . Stroke Father   . Heart attack Paternal Grandmother    Social History   Socioeconomic History  . Marital status: Widowed    Spouse name: Not on file  . Number of children: 2  . Years of education: 56  . Highest education level: Some college, no degree  Occupational History  . 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 Financial resource strain: Not hard at all  . Food insecurity    Worry: Never true    Inability: Never true  . Transportation needs    Medical: No  Non-medical: No  Tobacco Use  . Smoking status: Never Smoker  . Smokeless tobacco: Never Used  Substance and Sexual Activity  . Alcohol use: No    Alcohol/week: 0.0 standard drinks  . Drug use: No  . Sexual activity: Not Currently  Lifestyle  . Physical activity    Days per week: 7 days    Minutes per session: 60 min  . Stress: Not at all  Relationships  . Social Herbalist on phone: Three times a week    Gets together: Once a week    Attends religious service: Never    Active member of club or organization: No    Attends meetings of clubs or  organizations: Never    Relationship status: Widowed  Other Topics Concern  . Not on file  Social History Narrative   Works a part time job   Keeps grandkids    Tobacco Counseling Counseling given: No   Clinical Intake:  Pre-visit preparation completed: Yes  Pain : No/denies pain     Nutritional Risks: None Diabetes: No  How often do you need to have someone help you when you read instructions, pamphlets, or other written materials from your doctor or pharmacy?: 1 - Never What is the last grade level you completed in school?: 12  Interpreter Needed?: No  Information entered by :: Orlie Dakin, LPN   Activities of Daily Living In your present state of health, do you have any difficulty performing the following activities: 01/14/2019  Hearing? Y  Comment has noticed some hearing difficulty  Vision? N  Difficulty concentrating or making decisions? N  Walking or climbing stairs? N  Dressing or bathing? N  Doing errands, shopping? N  Preparing Food and eating ? N  Using the Toilet? N  In the past six months, have you accidently leaked urine? N  Do you have problems with loss of bowel control? N  Managing your Medications? N  Managing your Finances? N  Housekeeping or managing your Housekeeping? N  Some recent data might be hidden     Immunizations and Health Maintenance Immunization History  Administered Date(s) Administered  . Influenza,inj,Quad PF,6+ Mos 04/11/2014  . Influenza,inj,quad, With Preservative 04/01/2014  . Influenza-Unspecified 05/03/2015, 04/06/2016  . Pneumococcal Conjugate-13 05/03/2015  . Pneumococcal Polysaccharide-23 05/17/2016  . Tdap 03/04/2018   Health Maintenance Due  Topic Date Due  . MAMMOGRAM  05/12/2016  . DEXA SCAN  05/26/2017    Patient Care Team: Lavada Mesi as PCP - General (Family Medicine)  Indicate any recent Medical Services you may have received from other than [facility] providers in the past year (date may be  approximate).     Assessment:   This is a routine wellness examination for Liberti.Physical assessment deferred to PCP.   Hearing/Vision screen  Hearing Screening   125Hz  250Hz  500Hz  1000Hz  2000Hz  3000Hz  4000Hz  6000Hz  8000Hz   Right ear:           Left ear:           Comments: Whisper test done and patient reported back all three words correctly   Visual Acuity Screening   Right eye Left eye Both eyes  Without correction:     With correction: 20/40 20/25 20/25     Dietary issues and exercise activities discussed: Current Exercise Habits: Home exercise routine, Type of exercise: walking, Time (Minutes): 60, Frequency (Times/Week): 7, Weekly Exercise (Minutes/Week): 420, Intensity: Moderate, Exercise limited by: None identified Diet Eats a fairly healthy diet.  Eats about 30% of the time Breakfast: 1/2 bagel with cream cheese Lunch: skips Dinner: Meats and vehgetables or a [place] sandwich with raw vegetables. Does take calcium supplement. Drinks 32 ounces of water daily.      Goals    . Weight (lb) < 200 lb (90.7 kg)     Patient stated would like to loose 5 pounds.      Depression Screen PHQ 2/9 Scores 01/14/2019 03/04/2018  PHQ - 2 Score 0 0  PHQ- 9 Score - 0    Fall Risk Fall Risk  01/14/2019 03/04/2018  Falls in the past year? 0 No  Injury with Fall? 0 -  Follow up Falls prevention discussed -    Is the patient's home free of loose throw rugs in walkways, pet beds, electrical cords, etc?   yes      Grab bars in the bathroom? yes      Handrails on the stairs?   yes      Adequate lighting?   yes  Cognitive Function:     6CIT Screen 01/14/2019  What Year? 0 points  What month? 0 points  What time? 0 points  Count back from 20 0 points  Months in reverse 0 points  Repeat phrase 0 points  Total Score 0    Screening Tests Health Maintenance  Topic Date Due  . MAMMOGRAM  05/12/2016  . DEXA SCAN  05/26/2017  . INFLUENZA VACCINE  01/18/2019  . PNA vac Low Risk Adult  (2 of 2 - PPSV23) 05/17/2021  . TETANUS/TDAP  03/04/2028  . COLONOSCOPY  03/15/2028  . Hepatitis C Screening  Completed       Plan:      Ms. Orne , Thank you for taking time to come for your Medicare Wellness Visit. I appreciate your ongoing commitment to your health goals. Please review the following plan we discussed and let me know if I can assist you in the future.  Please schedule your next medicare wellness visit with me in 1 yr.   These are the goals we discussed: Goals    . Weight (lb) < 200 lb (90.7 kg)     Patient stated would like to loose 5 pounds.       This is a list of the screening recommended for you and due dates:  Health Maintenance  Topic Date Due  . Mammogram  05/12/2016  . DEXA scan (bone density measurement)  05/26/2017  . Flu Shot  01/18/2019  . Pneumonia vaccines (2 of 2 - PPSV23) 05/17/2021  . Tetanus Vaccine  03/04/2028  . Colon Cancer Screening  03/15/2028  .  Hepatitis C: One time screening is recommended by Center for Disease Control  (CDC) for  adults born from [birth year] through 1964.   Completed     These are the goals we discussed: Goals    . Weight (lb) < 200 lb (90.7 kg)     Patient stated would like to loose 5 pounds.       This is a list of the screening recommended for you and due dates:  Health Maintenance  Topic Date Due  . Mammogram  05/12/2016  . DEXA scan (bone density measurement)  05/26/2017  . Flu Shot  01/18/2019  . Pneumonia vaccines (2 of 2 - PPSV23) 05/17/2021  . Tetanus Vaccine  03/04/2028  . Colon Cancer Screening  03/15/2028  .  Hepatitis C: One time screening is recommended by Center for Disease Control  (CDC) for  adults born from [birth year] through 1964.   Completed     I have personally reviewed and noted the following in the patient's chart:   . Medical and social history . Use of alcohol, tobacco or illicit drugs  . Current medications and supplements . Functional ability and status . Nutritional  status . Physical activity . Advanced directives . List of other physicians . Hospitalizations, surgeries, and ER visits in previous 12 months . Vitals . Screenings to include cognitive, depression, and falls . Referrals and appointments  In addition, I have reviewed and discussed with patient certain preventive protocols, quality metrics, and best practice recommendations. A written personalized care plan for preventive services as well as general preventive health recommendations were provided to patient.     Joanne Chars, LPN   [id number]    .Marland KitchenMedical screening examination/treatment was performed by qualified clinical staff member and as supervising physician I was immediately available for consultation/collaboration. I have reviewed documentation and agree with assessment and plan.  Iran Planas, PA-C

## 2019-01-14 ENCOUNTER — Ambulatory Visit: Payer: PPO | Admitting: *Deleted

## 2019-01-14 ENCOUNTER — Other Ambulatory Visit: Payer: Self-pay

## 2019-01-14 VITALS — BP 102/69 | HR 73 | Temp 98.2°F | Ht 63.0 in | Wt 137.0 lb

## 2019-01-14 DIAGNOSIS — Z0000 Encounter for general adult medical examination without abnormal findings: Secondary | ICD-10-CM

## 2019-01-14 DIAGNOSIS — Z780 Asymptomatic menopausal state: Secondary | ICD-10-CM

## 2019-01-14 NOTE — Patient Instructions (Signed)
Ms. Steuart , Thank you for taking time to come for your Medicare Wellness Visit. I appreciate your ongoing commitment to your health goals. Please review the following plan we discussed and let me know if I can assist you in the future.  Please schedule your next medicare wellness visit with me in 1 yr. These are the goals we discussed: Goals    . Weight (lb) < 200 lb (90.7 kg)     Patient stated would like to loose 5 pounds.

## 2019-01-15 ENCOUNTER — Ambulatory Visit (INDEPENDENT_AMBULATORY_CARE_PROVIDER_SITE_OTHER): Payer: PPO

## 2019-01-15 DIAGNOSIS — Z0000 Encounter for general adult medical examination without abnormal findings: Secondary | ICD-10-CM

## 2019-01-15 DIAGNOSIS — Z780 Asymptomatic menopausal state: Secondary | ICD-10-CM

## 2019-01-15 DIAGNOSIS — M810 Age-related osteoporosis without current pathological fracture: Secondary | ICD-10-CM | POA: Diagnosis not present

## 2019-01-15 DIAGNOSIS — M8588 Other specified disorders of bone density and structure, other site: Secondary | ICD-10-CM | POA: Diagnosis not present

## 2019-01-17 NOTE — Progress Notes (Signed)
Your bone density has worsened from 2015. You are osteoporotic. I see where you have tried evista in the past. What about prolia infection every 6 months? Also are you on vitamin D at least 1000 units and calcium 1300mg  or 4 servings?

## 2019-01-20 NOTE — Progress Notes (Signed)
With having osteoporosis it is recommended to do both bone builders and vitamin D and calicum. Of cource at least one of those is better than none. Also low weight strength training can be very effective.

## 2019-01-21 NOTE — Progress Notes (Signed)
Ok great. Once authorized will need cmp within 30 days.

## 2019-01-22 ENCOUNTER — Telehealth: Payer: Self-pay | Admitting: Physician Assistant

## 2019-01-22 DIAGNOSIS — Z780 Asymptomatic menopausal state: Secondary | ICD-10-CM

## 2019-01-22 DIAGNOSIS — Z13820 Encounter for screening for osteoporosis: Secondary | ICD-10-CM

## 2019-01-22 NOTE — Telephone Encounter (Signed)
Received a message from PCP that patient wants to start on Prolia. I have filled out the form and waiting on PCP signature.

## 2019-01-24 NOTE — Telephone Encounter (Signed)
PCP has signed form and waiting on response from [facility].

## 2019-01-30 NOTE — Telephone Encounter (Signed)
Received a fax from [place] that no PA is needed for prolia. Labs ordered and we will schedule patient when labs are completed. Patient is aware labs are faxed to lab.

## 2019-01-31 DIAGNOSIS — Z780 Asymptomatic menopausal state: Secondary | ICD-10-CM | POA: Diagnosis not present

## 2019-01-31 DIAGNOSIS — Z13820 Encounter for screening for osteoporosis: Secondary | ICD-10-CM | POA: Diagnosis not present

## 2019-02-01 LAB — COMPLETE METABOLIC PANEL WITH GFR
AG Ratio: 1.7 (calc) (ref 1.0–2.5)
ALT: 9 U/L (ref 6–29)
AST: 17 U/L (ref 10–35)
Albumin: 4.1 g/dL (ref 3.6–5.1)
Alkaline phosphatase (APISO): 58 U/L (ref 37–153)
BUN: 14 mg/dL (ref 7–25)
CO2: 29 mmol/L (ref 20–32)
Calcium: 9.7 mg/dL (ref 8.6–10.4)
Chloride: 104 mmol/L (ref 98–110)
Creat: 0.94 mg/dL (ref 0.50–0.99)
GFR, Est African American: 73 mL/min/{1.73_m2} (ref >=60)
GFR, Est Non African American: 63 mL/min/{1.73_m2} (ref >=60)
Globulin: 2.4 g/dL (calc) (ref 1.9–3.7)
Glucose, Bld: 85 mg/dL (ref 65–99)
Potassium: 4.7 mmol/L (ref 3.5–5.3)
Sodium: 140 mmol/L (ref 135–146)
Total Bilirubin: 1 mg/dL (ref 0.2–1.2)
Total Protein: 6.5 g/dL (ref 6.1–8.1)

## 2019-02-03 NOTE — Telephone Encounter (Signed)
All labs are normal. 

## 2019-02-21 DIAGNOSIS — H43813 Vitreous degeneration, bilateral: Secondary | ICD-10-CM | POA: Diagnosis not present

## 2019-02-21 DIAGNOSIS — H04123 Dry eye syndrome of bilateral lacrimal glands: Secondary | ICD-10-CM | POA: Diagnosis not present

## 2019-02-21 DIAGNOSIS — H35372 Puckering of macula, left eye: Secondary | ICD-10-CM | POA: Diagnosis not present

## 2019-02-21 DIAGNOSIS — H524 Presbyopia: Secondary | ICD-10-CM | POA: Diagnosis not present

## 2019-05-09 ENCOUNTER — Telehealth: Payer: Self-pay | Admitting: Physician Assistant

## 2019-05-09 DIAGNOSIS — M810 Age-related osteoporosis without current pathological fracture: Secondary | ICD-10-CM | POA: Diagnosis not present

## 2019-05-09 NOTE — Telephone Encounter (Signed)
Dexa back in 12/2018. We were trying to get prolia approved. Can we get this looked into?

## 2019-05-09 NOTE — Telephone Encounter (Signed)
She just needs a recent Calcium. No PA required per last note. Patient is aware she needs to have the lab work. She can get schedule when we receive the labs.

## 2019-05-09 NOTE — Telephone Encounter (Signed)
Comments: get my first prolia injection. Please advise.  Thanks

## 2019-05-10 LAB — COMPLETE METABOLIC PANEL WITH GFR
AG Ratio: 1.7 (calc) (ref 1.0–2.5)
ALT: 11 U/L (ref 6–29)
AST: 16 U/L (ref 10–35)
Albumin: 4.1 g/dL (ref 3.6–5.1)
Alkaline phosphatase (APISO): 55 U/L (ref 37–153)
BUN/Creatinine Ratio: 21 (calc) (ref 6–22)
BUN: 22 mg/dL (ref 7–25)
CO2: 26 mmol/L (ref 20–32)
Calcium: 9 mg/dL (ref 8.6–10.4)
Chloride: 106 mmol/L (ref 98–110)
Creat: 1.06 mg/dL — ABNORMAL HIGH (ref 0.50–0.99)
GFR, Est African American: 63 mL/min/{1.73_m2} (ref >=60)
GFR, Est Non African American: 54 mL/min/{1.73_m2} — ABNORMAL LOW (ref >=60)
Globulin: 2.4 g/dL (calc) (ref 1.9–3.7)
Glucose, Bld: 89 mg/dL (ref 65–99)
Potassium: 4.5 mmol/L (ref 3.5–5.3)
Sodium: 143 mmol/L (ref 135–146)
Total Bilirubin: 1 mg/dL (ref 0.2–1.2)
Total Protein: 6.5 g/dL (ref 6.1–8.1)

## 2019-05-11 NOTE — Telephone Encounter (Signed)
Labs for prolia

## 2019-05-13 ENCOUNTER — Other Ambulatory Visit: Payer: Self-pay

## 2019-05-14 ENCOUNTER — Other Ambulatory Visit: Payer: Self-pay

## 2019-05-14 ENCOUNTER — Ambulatory Visit (INDEPENDENT_AMBULATORY_CARE_PROVIDER_SITE_OTHER): Payer: PPO | Admitting: Physician Assistant

## 2019-05-14 VITALS — BP 108/66 | HR 92 | Temp 98.4°F | Wt 138.0 lb

## 2019-05-14 DIAGNOSIS — M810 Age-related osteoporosis without current pathological fracture: Secondary | ICD-10-CM | POA: Diagnosis not present

## 2019-05-14 MED ORDER — DENOSUMAB 60 MG/ML ~~LOC~~ SOSY
60.0000 mg | PREFILLED_SYRINGE | Freq: Once | SUBCUTANEOUS | Status: AC
  Administered 2019-05-14: 16:00:00 60 mg via SUBCUTANEOUS

## 2019-05-14 NOTE — Progress Notes (Signed)
Pt in today for Prolia injection. Pt tolerated injection well. Observed pt 15 minutes with no immediate reaction. Advised pt she would need her next injection in 6 months.

## 2019-07-28 ENCOUNTER — Ambulatory Visit: Payer: PPO

## 2019-10-30 DIAGNOSIS — Z1231 Encounter for screening mammogram for malignant neoplasm of breast: Secondary | ICD-10-CM | POA: Diagnosis not present

## 2019-10-30 DIAGNOSIS — Z124 Encounter for screening for malignant neoplasm of cervix: Secondary | ICD-10-CM | POA: Diagnosis not present

## 2019-10-30 DIAGNOSIS — Z01419 Encounter for gynecological examination (general) (routine) without abnormal findings: Secondary | ICD-10-CM | POA: Diagnosis not present

## 2019-10-30 LAB — HM MAMMOGRAPHY

## 2019-10-30 LAB — HM PAP SMEAR: HM Pap smear: NORMAL

## 2019-11-10 ENCOUNTER — Encounter: Payer: Self-pay | Admitting: Physician Assistant

## 2019-11-10 MED ORDER — DENOSUMAB 60 MG/ML ~~LOC~~ SOSY: 60.0000 mg | PREFILLED_SYRINGE | SUBCUTANEOUS | 1 refills | Status: DC

## 2019-11-14 ENCOUNTER — Ambulatory Visit: Payer: PPO

## 2019-11-14 ENCOUNTER — Telehealth: Payer: Self-pay

## 2019-11-14 DIAGNOSIS — M818 Other osteoporosis without current pathological fracture: Secondary | ICD-10-CM | POA: Diagnosis not present

## 2019-11-14 DIAGNOSIS — Z13820 Encounter for screening for osteoporosis: Secondary | ICD-10-CM

## 2019-11-14 NOTE — Telephone Encounter (Signed)
Thank you :)

## 2019-11-14 NOTE — Telephone Encounter (Signed)
Tammy Humphrey will need lab work before [employer] injection. Patient advised and will go to the lab. Labs ordered per protocol. We can schedule when the labs return.

## 2019-11-15 LAB — COMPLETE METABOLIC PANEL WITH GFR
AG Ratio: 1.8 (calc) (ref 1.0–2.5)
ALT: 8 U/L (ref 6–29)
AST: 14 U/L (ref 10–35)
Albumin: 4 g/dL (ref 3.6–5.1)
Alkaline phosphatase (APISO): 42 U/L (ref 37–153)
BUN/Creatinine Ratio: 18 (calc) (ref 6–22)
BUN: 19 mg/dL (ref 7–25)
CO2: 26 mmol/L (ref 20–32)
Calcium: 9 mg/dL (ref 8.6–10.4)
Chloride: 107 mmol/L (ref 98–110)
Creat: 1.03 mg/dL — ABNORMAL HIGH (ref 0.50–0.99)
GFR, Est African American: 65 mL/min/{1.73_m2} (ref >=60)
GFR, Est Non African American: 56 mL/min/{1.73_m2} — ABNORMAL LOW (ref >=60)
Globulin: 2.2 g/dL (calc) (ref 1.9–3.7)
Glucose, Bld: 97 mg/dL (ref 65–139)
Potassium: 4.5 mmol/L (ref 3.5–5.3)
Sodium: 141 mmol/L (ref 135–146)
Total Bilirubin: 1 mg/dL (ref 0.2–1.2)
Total Protein: 6.2 g/dL (ref 6.1–8.1)

## 2019-11-18 NOTE — Telephone Encounter (Signed)
Ok for prolia injection

## 2019-11-20 ENCOUNTER — Encounter: Payer: Self-pay | Admitting: Family Medicine

## 2019-11-20 ENCOUNTER — Ambulatory Visit (INDEPENDENT_AMBULATORY_CARE_PROVIDER_SITE_OTHER): Payer: PPO | Admitting: Family Medicine

## 2019-11-20 VITALS — BP 101/71 | HR 93

## 2019-11-20 DIAGNOSIS — M810 Age-related osteoporosis without current pathological fracture: Secondary | ICD-10-CM

## 2019-11-20 DIAGNOSIS — Z13820 Encounter for screening for osteoporosis: Secondary | ICD-10-CM

## 2019-11-20 MED ORDER — DENOSUMAB 60 MG/ML ~~LOC~~ SOSY
60.0000 mg | PREFILLED_SYRINGE | Freq: Once | SUBCUTANEOUS | Status: AC
  Administered 2019-11-20: 60 mg via SUBCUTANEOUS

## 2019-11-20 NOTE — Progress Notes (Signed)
Patient is here for Prolia injection. Patient denies any issues with past injection. She had lab work completed and is okay to receive injection. Prolia injection to right arm with no apparent complications. Patient advised to schedule next injection in 6 months. She did bring her own medication from the pharmacy and this was documented in the [facility]. Labs were ordered for her to have drawn before future injection.

## 2019-11-20 NOTE — Progress Notes (Signed)
Agree with documentation as above.   Dacen Frayre, MD  

## 2019-12-13 ENCOUNTER — Other Ambulatory Visit: Payer: Self-pay | Admitting: Physician Assistant

## 2020-01-13 ENCOUNTER — Other Ambulatory Visit: Payer: Self-pay

## 2020-01-19 ENCOUNTER — Ambulatory Visit: Payer: PPO

## 2020-01-29 ENCOUNTER — Encounter: Payer: Self-pay | Admitting: Nurse Practitioner

## 2020-01-29 ENCOUNTER — Other Ambulatory Visit: Payer: Self-pay

## 2020-01-29 ENCOUNTER — Ambulatory Visit (INDEPENDENT_AMBULATORY_CARE_PROVIDER_SITE_OTHER): Payer: PPO | Admitting: Nurse Practitioner

## 2020-01-29 VITALS — BP 108/75 | HR 76 | Temp 97.4°F | Ht 62.5 in | Wt 136.8 lb

## 2020-01-29 DIAGNOSIS — Z0000 Encounter for general adult medical examination without abnormal findings: Secondary | ICD-10-CM | POA: Diagnosis not present

## 2020-01-29 DIAGNOSIS — Z23 Encounter for immunization: Secondary | ICD-10-CM

## 2020-01-29 MED ORDER — ZOSTER VAC RECOMB ADJUVANTED 50 MCG/0.5ML IM SUSR: 0.5000 mL | Freq: Once | INTRAMUSCULAR | 1 refills | Status: AC

## 2020-01-29 NOTE — Patient Instructions (Signed)
Bone Density Test The bone density test uses a special type of X-ray to measure the amount of calcium and other minerals in your bones. It can measure bone density in the hip and the spine. The test procedure is similar to having a regular X-ray. This test may also be called:  Bone densitometry.  Bone mineral density test.  Dual-energy X-ray absorptiometry (DEXA). You may have this test to:  Diagnose a condition that causes weak or thin bones (osteoporosis).  Screen you for osteoporosis.  Predict your risk for a broken bone (fracture).  Determine how well your osteoporosis treatment is working. Tell a health care provider about:  Any allergies you have.  All medicines you are taking, including vitamins, herbs, eye drops, creams, and over-the-counter medicines.  Any problems you or family members have had with anesthetic medicines.  Any blood disorders you have.  Any surgeries you have had.  Any medical conditions you have.  Whether you are pregnant or may be pregnant.  Any medical tests you have had within the past 14 days that used contrast material. What are the risks? Generally, this is a safe procedure. However, it does expose you to a small amount of radiation, which can slightly increase your cancer risk. What happens before the procedure?  Do not take any calcium supplements starting 24 hours before your test.  Remove all metal jewelry, eyeglasses, dental appliances, and any other metal objects. What happens during the procedure?   You will lie down on an exam table. There will be an X-ray generator below you and an imaging device above you.  Other devices, such as boxes or braces, may be used to position your body properly for the scan.  The machine will slowly scan your body. You will need to keep still.  The images will show up on a screen in the room. Images will be examined by a specialist after your test is done. The procedure may vary among health  care providers and hospitals. What happens after the procedure?  It is up to you to get your test results. Ask your health care provider, or the department that is doing the test, when your results will be ready. Summary  A bone density test is an imaging test that uses a type of X-ray to measure the amount of calcium and other minerals in your bones.  The test may be used to diagnose or screen you for a condition that causes weak or thin bones (osteoporosis), predict your risk for a broken bone (fracture), or determine how well your osteoporosis treatment is working.  Do not take any calcium supplements starting 24 hours before your test.  Ask your health care provider, or the department that is doing the test, when your results will be ready. This information is not intended to replace advice given to you by your health care provider. Make sure you discuss any questions you have with your health care provider. Document Revised: 06/21/2017 Document Reviewed: 04/09/2017 Elsevier Patient Education  [place] and Cholesterol Restricted Eating Plan Eating a diet that limits fat and cholesterol may help lower your risk for heart disease and other conditions. Your body needs fat and cholesterol for basic functions, but eating too much of these things can be harmful to your health. Your health care provider may order lab tests to check your blood fat (lipid) and cholesterol levels. This helps your health care provider understand your risk for certain conditions and whether you  need to make diet changes. Work with your health care provider or dietitian to make an eating plan that is right for you. Your plan includes:  Limit your fat intake to ______% or less of your total calories a day.  Limit your saturated fat intake to ______% or less of your total calories a day.  Limit the amount of cholesterol in your diet to less than _________mg a day.  Eat ___________ g of fiber a  day. What are tips for following this plan? General guidelines   If you are overweight, work with your health care provider to lose weight safely. Losing just 5-10% of your body weight can improve your overall health and help prevent diseases such as diabetes and heart disease.  Avoid: ? Foods with added sugar. ? Fried foods. ? Foods that contain partially hydrogenated oils, including stick margarine, some tub margarines, cookies, crackers, and other baked goods.  Limit alcohol intake to no more than 1 drink a day for nonpregnant women and 2 drinks a day for men. One drink equals 12 oz of beer, 5 oz of wine, or 1 oz of hard liquor. Reading food labels  Check food labels for: ? Trans fats, partially hydrogenated oils, or high amounts of saturated fat. Avoid foods that contain saturated fat and trans fat. ? The amount of cholesterol in each serving. Try to eat no more than 200 mg of cholesterol each day. ? The amount of fiber in each serving. Try to eat at least 20-30 g of fiber each day.  Choose foods with healthy fats, such as: ? Monounsaturated and polyunsaturated fats. These include olive and canola oil, flaxseeds, walnuts, almonds, and seeds. ? Omega-3 fats. These are found in foods such as salmon, mackerel, sardines, tuna, flaxseed oil, and ground flaxseeds.  Choose grain products that have whole grains. Look for the word "whole" as the first word in the ingredient list. Cooking  Cook foods using methods other than frying. Baking, boiling, grilling, and broiling are some healthy options.  Eat more home-cooked food and less restaurant, buffet, and fast food.  Avoid cooking using saturated fats. ? Animal sources of saturated fats include meats, butter, and cream. ? Plant sources of saturated fats include palm oil, palm kernel oil, and coconut oil. Meal planning   At meals, imagine dividing your plate into fourths: ? Fill one-half of your plate with vegetables and green  salads. ? Fill one-fourth of your plate with whole grains. ? Fill one-fourth of your plate with lean protein foods.  Eat fish that is high in omega-3 fats at least two times a week.  Eat more foods that contain fiber, such as whole grains, beans, apples, broccoli, carrots, peas, and barley. These foods help promote healthy cholesterol levels in the blood. Recommended foods Grains  Whole grains, such as whole wheat or whole grain breads, crackers, cereals, and pasta. Unsweetened oatmeal, bulgur, barley, quinoa, or brown rice. Corn or whole wheat flour tortillas. Vegetables  Fresh or frozen vegetables (raw, steamed, roasted, or grilled). Green salads. Fruits  All fresh, canned (in natural juice), or frozen fruits. Meats and other protein foods  Ground beef (85% or leaner), grass-fed beef, or beef trimmed of fat. Skinless chicken or [place]. Ground chicken or [place]. Pork trimmed of fat. All fish and seafood. Egg whites. Dried beans, peas, or lentils. Unsalted nuts or seeds. Unsalted canned beans. Natural nut butters without added sugar and oil. Dairy  Low-fat or nonfat dairy products, such as skim or 1%  milk, 2% or reduced-fat cheeses, low-fat and fat-free ricotta or cottage cheese, or plain low-fat and nonfat yogurt. Fats and oils  Tub margarine without trans fats. Light or reduced-fat mayonnaise and salad dressings. Avocado. Olive, canola, sesame, or safflower oils. The items listed above may not be a complete list of recommended foods or beverages. Contact your dietitian for more options. Foods to avoid Grains  White bread. White pasta. White rice. Cornbread. Bagels, pastries, and croissants. Crackers and snack foods that contain trans fat and hydrogenated oils. Vegetables  Vegetables cooked in cheese, cream, or butter sauce. Fried vegetables. Fruits  Canned fruit in heavy syrup. Fruit in cream or butter sauce. Fried fruit. Meats and other protein foods  Fatty cuts of meat.  Ribs, chicken wings, bacon, sausage, bologna, salami, chitterlings, fatback, hot dogs, bratwurst, and packaged lunch meats. Liver and organ meats. Whole eggs and egg yolks. Chicken and [place] with skin. Fried meat. Dairy  Whole or 2% milk, cream, half-and-half, and cream cheese. Whole milk cheeses. Whole-fat or sweetened yogurt. Full-fat cheeses. Nondairy creamers and whipped toppings. Processed cheese, cheese spreads, and cheese curds. Beverages  Alcohol. Sugar-sweetened drinks such as sodas, lemonade, and fruit drinks. Fats and oils  Butter, stick margarine, lard, shortening, ghee, or bacon fat. Coconut, palm kernel, and palm oils. Sweets and desserts  Corn syrup, sugars, honey, and molasses. Candy. Jam and jelly. Syrup. Sweetened cereals. Cookies, pies, cakes, donuts, muffins, and ice cream. The items listed above may not be a complete list of foods and beverages to avoid. Contact your dietitian for more information. Summary  Your body needs fat and cholesterol for basic functions. However, eating too much of these things can be harmful to your health.  Work with your health care provider and dietitian to follow a diet low in fat and cholesterol. Doing this may help lower your risk for heart disease and other conditions.  Choose healthy fats, such as monounsaturated and polyunsaturated fats, and foods high in omega-3 fatty acids.  Eat fiber-rich foods, such as whole grains, beans, peas, fruits, and vegetables.  Limit or avoid alcohol, fried foods, and foods high in saturated fats, partially hydrogenated oils, and sugar. This information is not intended to replace advice given to you by your health care provider. Make sure you discuss any questions you have with your health care provider. Document Revised: 05/18/2017 Document Reviewed: 02/20/2017 Elsevier Patient Education  [place] Prevention in the Home, Adult Falls can cause injuries. They can happen to people of  all ages. There are many things you can do to make your home safe and to help prevent falls. Ask for help when making these changes, if needed. What actions can I take to prevent falls? General Instructions  Use good lighting in all rooms. Replace any light bulbs that burn out.  Turn on the lights when you go into a dark area. Use night-lights.  Keep items that you use often in easy-to-reach places. Lower the shelves around your home if necessary.  Set up your furniture so you have a clear path. Avoid moving your furniture around.  Do not have throw rugs and other things on the floor that can make you trip.  Avoid walking on wet floors.  If any of your floors are uneven, fix them.  Add color or contrast paint or tape to clearly mark and help you see: ? Any grab bars or handrails. ? First and last steps of stairways. ? Where the edge of each  step is.  If you use a stepladder: ? Make sure that it is fully opened. Do not climb a closed stepladder. ? Make sure that both sides of the stepladder are locked into place. ? Ask someone to hold the stepladder for you while you use it.  If there are any pets around you, be aware of where they are. What can I do in the bathroom?      Keep the floor dry. Clean up any water that spills onto the floor as soon as it happens.  Remove soap buildup in the tub or shower regularly.  Use non-skid mats or decals on the floor of the tub or shower.  Attach bath mats securely with double-sided, non-slip rug tape.  If you need to sit down in the shower, use a plastic, non-slip stool.  Install grab bars by the toilet and in the tub and shower. Do not use towel bars as grab bars. What can I do in the bedroom?  Make sure that you have a light by your bed that is easy to reach.  Do not use any sheets or blankets that are too big for your bed. They should not hang down onto the floor.  Have a firm chair that has side arms. You can use this for  support while you get dressed. What can I do in the kitchen?  Clean up any spills right away.  If you need to reach something above you, use a strong step stool that has a grab bar.  Keep electrical cords out of the way.  Do not use floor polish or wax that makes floors slippery. If you must use wax, use non-skid floor wax. What can I do with my stairs?  Do not leave any items on the stairs.  Make sure that you have a light switch at the top of the stairs and the bottom of the stairs. If you do not have them, ask someone to add them for you.  Make sure that there are handrails on both sides of the stairs, and use them. Fix handrails that are broken or loose. Make sure that handrails are as long as the stairways.  Install non-slip stair treads on all stairs in your home.  Avoid having throw rugs at the top or bottom of the stairs. If you do have throw rugs, attach them to the floor with carpet tape.  Choose a carpet that does not hide the edge of the steps on the stairway.  Check any carpeting to make sure that it is firmly attached to the stairs. Fix any carpet that is loose or worn. What can I do on the outside of my home?  Use bright outdoor lighting.  Regularly fix the edges of walkways and driveways and fix any cracks.  Remove anything that might make you trip as you walk through a door, such as a raised step or threshold.  Trim any bushes or trees on the path to your home.  Regularly check to see if handrails are loose or broken. Make sure that both sides of any steps have handrails.  Install guardrails along the edges of any raised decks and porches.  Clear walking paths of anything that might make someone trip, such as tools or rocks.  Have any leaves, snow, or ice cleared regularly.  Use sand or salt on walking paths during winter.  Clean up any spills in your garage right away. This includes grease or oil spills. What other actions can I  take?  Wear shoes  that: ? Have a low heel. Do not wear high heels. ? Have rubber bottoms. ? Are comfortable and fit you well. ? Are closed at the toe. Do not wear open-toe sandals.  Use tools that help you move around (mobility aids) if they are needed. These include: ? Canes. ? Walkers. ? Scooters. ? Crutches.  Review your medicines with your doctor. Some medicines can make you feel dizzy. This can increase your chance of falling. Ask your doctor what other things you can do to help prevent falls. Where to find more information  Centers for Disease Control and Prevention, [facility]: https://garcia.biz/  [employer] on Aging: BrainJudge.co.uk Contact a doctor if:  You are afraid of falling at home.  You feel weak, drowsy, or dizzy at home.  You fall at home. Summary  There are many simple things that you can do to make your home safe and to help prevent falls.  Ways to make your home safe include removing tripping hazards and installing grab bars in the bathroom.  Ask for help when making these changes in your home. This information is not intended to replace advice given to you by your health care provider. Make sure you discuss any questions you have with your health care provider. Document Revised: 09/26/2018 Document Reviewed: 01/18/2017 Elsevier Patient Education  2020 [place] Maintenance, Female Adopting a healthy lifestyle and getting preventive care are important in promoting health and wellness. Ask your health care provider about:  The right schedule for you to have regular tests and exams.  Things you can do on your own to prevent diseases and keep yourself healthy. What should I know about diet, weight, and exercise? Eat a healthy diet   Eat a diet that includes plenty of vegetables, fruits, low-fat dairy products, and lean protein.  Do not eat a lot of foods that are high in solid fats, added sugars, or sodium. Maintain a healthy weight Body  mass index (BMI) is used to identify weight problems. It estimates body fat based on height and weight. Your health care provider can help determine your BMI and help you achieve or maintain a healthy weight. Get regular exercise Get regular exercise. This is one of the most important things you can do for your health. Most adults should:  Exercise for at least 150 minutes each week. The exercise should increase your heart rate and make you sweat (moderate-intensity exercise).  Do strengthening exercises at least twice a week. This is in addition to the moderate-intensity exercise.  Spend less time sitting. Even light physical activity can be beneficial. Watch cholesterol and blood lipids Have your blood tested for lipids and cholesterol at 68 years of age, then have this test every 5 years. Have your cholesterol levels checked more often if:  Your lipid or cholesterol levels are high.  You are older than 68 years of age.  You are at high risk for heart disease. What should I know about cancer screening? Depending on your health history and family history, you may need to have cancer screening at various ages. This may include screening for:  Breast cancer.  Cervical cancer.  Colorectal cancer.  Skin cancer.  Lung cancer. What should I know about heart disease, diabetes, and high blood pressure? Blood pressure and heart disease  High blood pressure causes heart disease and increases the risk of stroke. This is more likely to develop in people who have high blood pressure readings, are  of African descent, or are overweight.  Have your blood pressure checked: ? Every 3-5 years if you are 36-79 years of age. ? Every year if you are 83 years old or older. Diabetes Have regular diabetes screenings. This checks your fasting blood sugar level. Have the screening done:  Once every three years after age 3 if you are at a normal weight and have a low risk for diabetes.  More often  and at a younger age if you are overweight or have a high risk for diabetes. What should I know about preventing infection? Hepatitis B If you have a higher risk for hepatitis B, you should be screened for this virus. Talk with your health care provider to find out if you are at risk for hepatitis B infection. Hepatitis C Testing is recommended for:  Everyone born from [birth year] through 1964.  Anyone with known risk factors for hepatitis C. Sexually transmitted infections (STIs)  Get screened for STIs, including gonorrhea and chlamydia, if: ? You are sexually active and are younger than 68 years of age. ? You are older than 68 years of age and your health care provider tells you that you are at risk for this type of infection. ? Your sexual activity has changed since you were last screened, and you are at increased risk for chlamydia or gonorrhea. Ask your health care provider if you are at risk.  Ask your health care provider about whether you are at high risk for HIV. Your health care provider may recommend a prescription medicine to help prevent HIV infection. If you choose to take medicine to prevent HIV, you should first get tested for HIV. You should then be tested every 3 months for as long as you are taking the medicine. Pregnancy  If you are about to stop having your period (premenopausal) and you may become pregnant, seek counseling before you get pregnant.  Take 400 to 800 micrograms (mcg) of folic acid every day if you become pregnant.  Ask for birth control (contraception) if you want to prevent pregnancy. Osteoporosis and menopause Osteoporosis is a disease in which the bones lose minerals and strength with aging. This can result in bone fractures. If you are 82 years old or older, or if you are at risk for osteoporosis and fractures, ask your health care provider if you should:  Be screened for bone loss.  Take a calcium or vitamin D supplement to lower your risk of  fractures.  Be given hormone replacement therapy (HRT) to treat symptoms of menopause. Follow these instructions at home: Lifestyle  Do not use any products that contain nicotine or tobacco, such as cigarettes, e-cigarettes, and chewing tobacco. If you need help quitting, ask your health care provider.  Do not use street drugs.  Do not share needles.  Ask your health care provider for help if you need support or information about quitting drugs. Alcohol use  Do not drink alcohol if: ? Your health care provider tells you not to drink. ? You are pregnant, may be pregnant, or are planning to become pregnant.  If you drink alcohol: ? Limit how much you use to 0-1 drink a day. ? Limit intake if you are breastfeeding.  Be aware of how much alcohol is in your drink. In the [place] one drink equals one 12 oz bottle of beer (355 mL), one 5 oz glass of wine (148 mL), or one 1 oz glass of hard liquor (44 mL). General instructions  Schedule regular  health, dental, and eye exams.  Stay current with your vaccines.  Tell your health care provider if: ? You often feel depressed. ? You have ever been abused or do not feel safe at home. Summary  Adopting a healthy lifestyle and getting preventive care are important in promoting health and wellness.  Follow your health care provider's instructions about healthy diet, exercising, and getting tested or screened for diseases.  Follow your health care provider's instructions on monitoring your cholesterol and blood pressure. This information is not intended to replace advice given to you by your health care provider. Make sure you discuss any questions you have with your health care provider. Document Revised: 05/29/2018 Document Reviewed: 05/29/2018 Elsevier Patient Education  2020 [employer].

## 2020-01-29 NOTE — Progress Notes (Signed)
Subjective:   Tammy Humphrey is a 69 y.o. female who presents for Medicare Annual (Subsequent) preventive examination.  Review of Systems    Denies shortness of breath, chest pain, palpitations Denies cough, sore throat, sinus p/p Denies joint pain, muscle pain, difficulty walking Denies bowel or bladder changes  Cardiac Risk Factors include: none     Objective:    Today's Vitals   01/29/20 1309  BP: 108/75  Pulse: 76  Temp: (!) 97.4 F (36.3 C)  TempSrc: Oral  SpO2: 98%  Weight: 136 lb 12.8 oz (62.1 kg)  Height: 5' 2.5" (1.588 m)   Body mass index is 24.62 kg/m.  Advanced Directives 01/29/2020 01/14/2019 08/03/2014  Does Patient Have a Medical Advance Directive? No No Yes;No  Does patient want to make changes to medical advance directive? - - No - Patient declined  Would patient like information on creating a medical advance directive? Yes (MAU/Ambulatory/Procedural Areas - Information given) No - Patient declined -    Current Medications (verified) Outpatient Encounter Medications as of 01/29/2020  Medication Sig  . Calcium 600-200 MG-UNIT tablet Take 1 tablet by mouth 2 (two) times daily.  Marland Kitchen denosumab (PROLIA) 60 MG/ML SOSY injection Inject 60 mg into the skin every 6 (six) months. Administer in upper arm, thigh, or abdomen  . levothyroxine (SYNTHROID) 75 MCG tablet Take 1 tablet (75 mcg total) by mouth daily before breakfast. Needs labs  . Zoster Vaccine Adjuvanted [facility]) injection Inject 0.5 mLs into the muscle once for 1 dose. Repeat dose 2-6 mos   No facility-administered encounter medications on file as of 01/29/2020.    Allergies (verified) Patient has no known allergies.   History: Past Medical History:  Diagnosis Date  . Thyroid disease    History reviewed. No pertinent surgical history. Family History  Problem Relation Age of Onset  . Hypertension Mother   . Heart attack Father   . Stroke Father   . Heart attack Paternal Grandmother     Social History   Socioeconomic History  . Marital status: Widowed    Spouse name: Not on file  . Number of children: 2  . Years of education: 78  . Highest education level: Some college, no degree  Occupational History  . 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 Smoking status: Never Smoker  . Smokeless tobacco: Never Used  Vaping Use  . Vaping Use: Never used  Substance and Sexual Activity  . Alcohol use: No    Alcohol/week: 0.0 standard drinks  . Drug use: No  . Sexual activity: Not Currently  Other Topics Concern  . Not on file  Social History Narrative   Works a part time job   Keeps grandkids   Social Determinants of Radio broadcast assistant Strain: [place]   . Difficulty of Paying Living Expenses: Not hard at all  Food Insecurity: No Food Insecurity  . Worried About Charity fundraiser in the Last Year: Never true  . Ran Out of Food in the Last Year: Never true  Transportation Needs: No Transportation Needs  . Lack of Transportation (Medical): No  . Lack of Transportation (Non-Medical): No  Physical Activity: Sufficiently Active  . Days of Exercise per Week: 5 days  . Minutes of Exercise per Session: 40 min  Stress: No Stress Concern Present  . Feeling of Stress : Not at all  Social Connections: Moderately Integrated  . Frequency of Communication with Friends and  Family: More than three times a week  . Frequency of Social Gatherings with Friends and Family: More than three times a week  . Attends Religious Services: More than 4 times per year  . Active Member of Clubs or Organizations: Yes  . Attends Archivist Meetings: More than 4 times per year  . Marital Status: Widowed    Tobacco Counseling N/A  Clinical Intake:  Pre-visit preparation completed: Yes  Pain : No/denies pain     Diabetes: No  How often do you need to have someone help you when you read instructions, pamphlets, or other written  materials from your doctor or pharmacy?: 1 - Never What is the last grade level you completed in school?: High school and some college  Diabetic No  Interpreter Needed?: No  Information entered by :: [employer] of Daily Living In your present state of health, do you have any difficulty performing the following activities: 01/29/2020  Hearing? N  Vision? N  Difficulty concentrating or making decisions? N  Walking or climbing stairs? N  Dressing or bathing? N  Doing errands, shopping? N  Preparing Food and eating ? N  Using the Toilet? N  In the past six months, have you accidently leaked urine? N  Do you have problems with loss of bowel control? N  Managing your Medications? N  Managing your Finances? N  Housekeeping or managing your Housekeeping? N  Some recent data might be hidden    Patient Care Team: Lavada Mesi as PCP - General (Family Medicine)  Indicate any recent Medical Services you may have received from other than [facility] providers in the past year (date may be approximate).     Assessment:   This is a routine wellness examination for Tammy Humphrey.  Hearing/Vision screen No exam data present  Dietary issues and exercise activities discussed: Current Exercise Habits: Home exercise routine, Type of exercise: walking, Time (Minutes): 40, Frequency (Times/Week): 5, Weekly Exercise (Minutes/Week): 200, Intensity: Moderate, Exercise limited by: None identified  Goals    . Weight (lb) < 200 lb (90.7 kg)     Patient stated would like to loose 5 pounds.    . Weight (lb) < 200 lb (90.7 kg)     Patient would like to lose 5 lbs over the next year.       Depression Screen PHQ 2/9 Scores 01/29/2020 01/14/2019 03/04/2018  PHQ - 2 Score 0 0 0  PHQ- 9 Score - - 0    Fall Risk Fall Risk  01/29/2020 05/13/2019 01/14/2019 03/04/2018  Falls in the past year? 0 0 0 No  Comment - Emmi Telephone Survey: data to providers prior to load - -  Number falls in past yr: 0  - - -  Injury with Fall? - - 0 -  Follow up Falls evaluation completed - Falls prevention discussed -    Any stairs in or around the home? yes- into the front door and back door- none inside the home If so, are there any without handrails? Yes  Home free of loose throw rugs in walkways, pet beds, electrical cords, etc? No  Adequate lighting in your home to reduce risk of falls? Yes   ASSISTIVE DEVICES UTILIZED TO PREVENT FALLS:  Life alert? No  Use of a cane, walker or w/c? No  Grab bars in the bathroom? Yes  Shower chair or bench in shower? No  Elevated toilet seat or a handicapped toilet? No   TIMED UP  AND GO:  Was the test performed? Yes .  Length of time to ambulate 10 feet: 2 sec.   Gait steady and fast without use of assistive device  Cognitive Function:     6CIT Screen 01/29/2020 01/14/2019  What Year? 0 points 0 points  What month? 0 points 0 points  What time? 0 points 0 points  Count back from 20 0 points 0 points  Months in reverse 0 points 0 points  Repeat phrase 0 points 0 points  Total Score 0 0    Immunizations Immunization History  Administered Date(s) Administered  . Influenza, High Dose Seasonal PF 04/11/2018, 04/16/2019  . Influenza,inj,Quad PF,6+ Mos 04/11/2014  . Influenza,inj,quad, With Preservative 04/01/2014  . Influenza-Unspecified 05/03/2015, 04/06/2016, 04/19/2018  . Moderna SARS-COVID-2 Vaccination 07/15/2019, 08/12/2019  . Pneumococcal Conjugate-13 05/03/2015  . Pneumococcal Polysaccharide-23 05/17/2016  . Tdap 03/04/2018    TDAP status: Up to date Flu Vaccine status: Up to date Pneumococcal vaccine status: Up to date Covid-19 vaccine status: Completed vaccines  Qualifies for Shingles Vaccine? Yes   Zostavax completed Yes   Shingrix Completed?: No.    Education has been provided regarding the importance of this vaccine. Patient has been advised to call insurance company to determine out of pocket expense if they have not yet  received this vaccine. Advised may also receive vaccine at local pharmacy or Health Dept. Verbalized acceptance and understanding. Prescription written today.  Screening Tests Health Maintenance  Topic Date Due  . INFLUENZA VACCINE  01/18/2020  . MAMMOGRAM  07/31/2020 (Originally 05/12/2016)  . PNA vac Low Risk Adult (2 of 2 - PPSV23) 05/17/2021  . DEXA SCAN  01/14/2022  . TETANUS/TDAP  03/04/2028  . COLONOSCOPY  03/15/2028  . COVID-19 Vaccine  Completed  . Hepatitis C Screening  Completed    Health Maintenance  Health Maintenance Due  Topic Date Due  . INFLUENZA VACCINE  01/18/2020    Colorectal cancer screening: Completed 02/2018. Repeat every 10 years Mammogram status: Completed 07/31/2020. Repeat every year Bone Density status: Completed 01/14/2022. Results reflect: Bone density results: OSTEOPOROSIS. Repeat every 2 years.  Lung Cancer Screening: (Low Dose CT Chest recommended if Age 82-80 years, 30 pack-year currently smoking OR have quit w/in 15years.) does not qualify.   Lung Cancer Screening Referral: N/A  Additional Screening:  Hepatitis C Screening: does not qualify; Completed   Vision Screening: Recommended annual ophthalmology exams for Gerrianne Aydelott detection of glaucoma and other disorders of the eye. Is the patient up to date with their annual eye exam?  Yes  October 2020 Who is the provider or what is the name of the office in which the patient attends annual eye exams? Dr. Satira Sark with [facility] If pt is not established with a provider, would they like to be referred to a provider to establish care? .   Dental Screening: Recommended annual dental exams for proper oral hygiene  Community Resource Referral / Chronic Care Management: CRR required this visit?  No   CCM required this visit?  No      Plan:     I have personally reviewed and noted the following in the patient's chart:   . Medical and social history . Use of alcohol, tobacco or  illicit drugs  . Current medications and supplements . Functional ability and status . Nutritional status . Physical activity . Advanced directives . List of other physicians . Hospitalizations, surgeries, and ER visits in previous 12 months . Vitals . Screenings to include cognitive, depression,  and falls . Referrals and appointments  In addition, I have reviewed and discussed with patient certain preventive protocols, quality metrics, and best practice recommendations. A written personalized care plan for preventive services as well as general preventive health recommendations were provided to patient.     Orma Render, NP   01/29/2020

## 2020-02-24 DIAGNOSIS — H35372 Puckering of macula, left eye: Secondary | ICD-10-CM | POA: Diagnosis not present

## 2020-02-24 DIAGNOSIS — H43813 Vitreous degeneration, bilateral: Secondary | ICD-10-CM | POA: Diagnosis not present

## 2020-02-24 DIAGNOSIS — H524 Presbyopia: Secondary | ICD-10-CM | POA: Diagnosis not present

## 2020-02-24 DIAGNOSIS — H40013 Open angle with borderline findings, low risk, bilateral: Secondary | ICD-10-CM | POA: Diagnosis not present

## 2020-03-12 ENCOUNTER — Other Ambulatory Visit: Payer: Self-pay | Admitting: Physician Assistant

## 2020-03-12 DIAGNOSIS — E039 Hypothyroidism, unspecified: Secondary | ICD-10-CM

## 2020-03-16 ENCOUNTER — Ambulatory Visit (INDEPENDENT_AMBULATORY_CARE_PROVIDER_SITE_OTHER): Payer: PPO

## 2020-03-16 ENCOUNTER — Encounter: Payer: Self-pay | Admitting: Physician Assistant

## 2020-03-16 ENCOUNTER — Ambulatory Visit (INDEPENDENT_AMBULATORY_CARE_PROVIDER_SITE_OTHER): Payer: PPO | Admitting: Physician Assistant

## 2020-03-16 ENCOUNTER — Other Ambulatory Visit: Payer: Self-pay

## 2020-03-16 VITALS — BP 119/71 | HR 86 | Ht 62.5 in | Wt 138.0 lb

## 2020-03-16 DIAGNOSIS — S76319A Strain of muscle, fascia and tendon of the posterior muscle group at thigh level, unspecified thigh, initial encounter: Secondary | ICD-10-CM

## 2020-03-16 DIAGNOSIS — M25551 Pain in right hip: Secondary | ICD-10-CM | POA: Diagnosis not present

## 2020-03-16 DIAGNOSIS — D508 Other iron deficiency anemias: Secondary | ICD-10-CM | POA: Diagnosis not present

## 2020-03-16 DIAGNOSIS — M2550 Pain in unspecified joint: Secondary | ICD-10-CM

## 2020-03-16 DIAGNOSIS — M542 Cervicalgia: Secondary | ICD-10-CM

## 2020-03-16 DIAGNOSIS — E039 Hypothyroidism, unspecified: Secondary | ICD-10-CM

## 2020-03-16 DIAGNOSIS — M25552 Pain in left hip: Secondary | ICD-10-CM

## 2020-03-16 NOTE — Progress Notes (Deleted)
Last prolia injection 11/20/2019 Joint stiffness started one month ago  Back of legs/neck

## 2020-03-16 NOTE — Patient Instructions (Signed)
Hip Exercises Ask your health care provider which exercises are safe for you. Do exercises exactly as told by your health care provider and adjust them as directed. It is normal to feel mild stretching, pulling, tightness, or discomfort as you do these exercises. Stop right away if you feel sudden pain or your pain gets worse. Do not begin these exercises until told by your health care provider. Stretching and range-of-motion exercises These exercises warm up your muscles and joints and improve the movement and flexibility of your hip. These exercises also help to relieve pain, numbness, and tingling. You may be asked to limit your range of motion if you had a hip replacement. Talk to your health care provider about these restrictions. Hamstrings, supine  1. Lie on your back (supine position). 2. Loop a belt or towel over the ball of your left / right foot. The ball of your foot is on the walking surface, right under your toes. 3. Straighten your left / right knee and slowly pull on the belt or towel to raise your leg until you feel a gentle stretch behind your knee (hamstring). ? Do not let your knee bend while you do this. ? Keep your other leg flat on the floor. 4. Hold this position for __________ seconds. 5. Slowly return your leg to the starting position. Repeat __________ times. Complete this exercise __________ times a day. Hip rotation  1. Lie on your back on a firm surface. 2. With your left / right hand, gently pull your left / right knee toward the shoulder that is on the same side of the body. Stop when your knee is pointing toward the ceiling. 3. Hold your left / right ankle with your other hand. 4. Keeping your knee steady, gently pull your left / right ankle toward your other shoulder until you feel a stretch in your buttocks. ? Keep your hips and shoulders firmly planted while you do this stretch. 5. Hold this position for __________ seconds. Repeat __________ times. Complete  this exercise __________ times a day. Seated stretch This exercise is sometimes called hamstrings and adductors stretch. 1. Sit on the floor with your legs stretched wide. Keep your knees straight during this exercise. 2. Keeping your head and back in a straight line, bend at your waist to reach for your left foot (position A). You should feel a stretch in your right inner thigh (adductors). 3. Hold this position for __________ seconds. Then slowly return to the upright position. 4. Keeping your head and back in a straight line, bend at your waist to reach forward (position B). You should feel a stretch behind both of your thighs and knees (hamstrings). 5. Hold this position for __________ seconds. Then slowly return to the upright position. 6. Keeping your head and back in a straight line, bend at your waist to reach for your right foot (position C). You should feel a stretch in your left inner thigh (adductors). 7. Hold this position for __________ seconds. Then slowly return to the upright position. Repeat __________ times. Complete this exercise __________ times a day. Lunge This exercise stretches the muscles of the hip (hip flexors). 1. Place your left / right knee on the floor and bend your other knee so that is directly over your ankle. You should be half-kneeling. 2. Keep good posture with your head over your shoulders. 3. Tighten your buttocks to point your tailbone downward. This will prevent your back from arching too much. 4. You should feel a  gentle stretch in the front of your left / right thigh and hip. If you do not feel a stretch, slide your other foot forward slightly and then slowly lunge forward with your chest up until your knee once again lines up over your ankle. ? Make sure your tailbone continues to point downward. 5. Hold this position for __________ seconds. 6. Slowly return to the starting position. Repeat __________ times. Complete this exercise __________ times a  day. Strengthening exercises These exercises build strength and endurance in your hip. Endurance is the ability to use your muscles for a long time, even after they get tired. Bridge This exercise strengthens the muscles of your hip (hip extensors). 1. Lie on your back on a firm surface with your knees bent and your feet flat on the floor. 2. Tighten your buttocks muscles and lift your bottom off the floor until the trunk of your body and your hips are level with your thighs. ? Do not arch your back. ? You should feel the muscles working in your buttocks and the back of your thighs. If you do not feel these muscles, slide your feet 1-2 inches (2.5-5 cm) farther away from your buttocks. 3. Hold this position for __________ seconds. 4. Slowly lower your hips to the starting position. 5. Let your muscles relax completely between repetitions. Repeat __________ times. Complete this exercise __________ times a day. Straight leg raises, side-lying This exercise strengthens the muscles that move the hip joint away from the center of the body (hip abductors). 1. Lie on your side with your left / right leg in the top position. Lie so your head, shoulder, hip, and knee line up. You may bend your bottom knee slightly to help you balance. 2. Roll your hips slightly forward, so your hips are stacked directly over each other and your left / right knee is facing forward. 3. Leading with your heel, lift your top leg 4-6 inches (10-15 cm). You should feel the muscles in your top hip lifting. ? Do not let your foot drift forward. ? Do not let your knee roll toward the ceiling. 4. Hold this position for __________ seconds. 5. Slowly return to the starting position. 6. Let your muscles relax completely between repetitions. Repeat __________ times. Complete this exercise __________ times a day. Straight leg raises, side-lying This exercise strengthens the muscles that move the hip joint toward the center of the  body (hip adductors). 1. Lie on your side with your left / right leg in the bottom position. Lie so your head, shoulder, hip, and knee line up. You may place your upper foot in front to help you balance. 2. Roll your hips slightly forward, so your hips are stacked directly over each other and your left / right knee is facing forward. 3. Tense the muscles in your inner thigh and lift your bottom leg 4-6 inches (10-15 cm). 4. Hold this position for __________ seconds. 5. Slowly return to the starting position. 6. Let your muscles relax completely between repetitions. Repeat __________ times. Complete this exercise __________ times a day. Straight leg raises, supine This exercise strengthens the muscles in the front of your thigh (quadriceps). 1. Lie on your back (supine position) with your left / right leg extended and your other knee bent. 2. Tense the muscles in the front of your left / right thigh. You should see your kneecap slide up or see increased dimpling just above your knee. 3. Keep these muscles tight as you raise your   leg 4-6 inches (10-15 cm) off the floor. Do not let your knee bend. 4. Hold this position for __________ seconds. 5. Keep these muscles tense as you lower your leg. 6. Relax the muscles slowly and completely between repetitions. Repeat __________ times. Complete this exercise __________ times a day. Hip abductors, standing This exercise strengthens the muscles that move the leg and hip joint away from the center of the body (hip abductors). 1. Tie one end of a rubber exercise band or tubing to a secure surface, such as a chair, table, or pole. 2. Loop the other end of the band or tubing around your left / right ankle. 3. Keeping your ankle with the band or tubing directly opposite the secured end, step away until there is tension in the tubing or band. Hold on to a chair, table, or pole as needed for balance. 4. Lift your left / right leg out to your side. While you do  this: ? Keep your back upright. ? Keep your shoulders over your hips. ? Keep your toes pointing forward. ? Make sure to use your hip muscles to slowly lift your leg. Do not tip your body or forcefully lift your leg. 5. Hold this position for __________ seconds. 6. Slowly return to the starting position. Repeat __________ times. Complete this exercise __________ times a day. Squats This exercise strengthens the muscles in the front of your thigh (quadriceps). 1. Stand in a door frame so your feet and knees are in line with the frame. You may place your hands on the frame for balance. 2. Slowly bend your knees and lower your hips like you are going to sit in a chair. ? Keep your lower legs in a straight-up-and-down position. ? Do not let your hips go lower than your knees. ? Do not bend your knees lower than told by your health care provider. ? If your hip pain increases, do not bend as low. 3. Hold this position for ___________ seconds. 4. Slowly push with your legs to return to standing. Do not use your hands to pull yourself to standing. Repeat __________ times. Complete this exercise __________ times a day. This information is not intended to replace advice given to you by your health care provider. Make sure you discuss any questions you have with your health care provider. Document Revised: 01/09/2019 Document Reviewed: 04/16/2018 Elsevier Patient Education  2020 [place] Ask your health care provider which exercises are safe for you. Do exercises exactly as told by your health care provider and adjust them as directed. It is normal to feel mild stretching, pulling, tightness, or discomfort as you do these exercises. Stop right away if you feel sudden pain or your pain gets worse. Do not begin these exercises until told by your health care provider. Stretching and range-of-motion exercises These exercises warm up your muscles and joints and improve the  movement and flexibility of your thighs. These exercises also help to relieve pain, numbness, and tingling. Talk to your health care provider about these restrictions. Knee extension, seated  1. Sit with your left / right heel propped on a chair, a coffee table, or a footstool. Do not have anything under your knee to support it. 2. Allow your leg muscles to relax, letting gravity straighten out your knee (extension). You should feel a stretch behind your left / right knee. 3. If told by your health care provider, deepen the stretch by placing a __________ weight on your thigh, just  above your kneecap. 4. Hold this position for __________ seconds. Repeat __________ times. Complete this exercise __________ times a day. Seated stretch This exercise is sometimes called hamstrings and adductors stretch. 1. Sit on the floor with your legs stretched wide. Keep your knees straight during this exercise. 2. Keeping your head and back in a straight line, bend at your waist to reach for your left foot (position A). You should feel a stretch in your right inner thigh (adductors). 3. Hold this position for __________ seconds. Then slowly return to the upright position. 4. Keeping your head and back in a straight line, bend at your waist to reach forward (position B). You should feel a stretch behind both of your thighs or knees (hamstrings). 5. Hold this position for __________ seconds. Then slowly return to the upright position. 6. Keeping your head and back in a straight line, bend at your waist to reach for your right foot (position C). You should feel a stretch in your left inner thigh (adductors). 7. Hold this position for __________ seconds. Then slowly return to the upright position. Repeat __________ times. Complete this exercise __________ times a day. Hamstrings stretch, supine  1. Lie on your back (supine position). 2. Loop a belt or towel over the ball of your left / right foot. The ball of your  foot is on the walking surface, right under your toes. 3. Straighten your left / right knee and slowly pull on the belt or towel to raise your leg. ? Do not let your left / right knee bend while you do this. ? Keep your other leg flat on the floor. ? Raise the left / right leg until you feel a gentle stretch behind your left / right knee or thigh (hamstrings). 4. Hold this position for __________ seconds. 5. Slowly return your leg to the starting position. Repeat __________ times. Complete this exercise __________ times a day. Strengthening exercises These exercises build strength and endurance in your thighs. Endurance is the ability to use your muscles for a long time, even after they get tired. Straight leg raises, prone This exercise strengthens the muscles that move the hips (hip extensors). 1. Lie on your abdomen on a firm surface (prone position). 2. Tense the muscles in your buttocks and lift your left / right leg about 4 inches (10 cm). Keep your knee straight as you lift your leg. If you cannot lift your leg that high without arching your back, place a pillow under your hips. 3. Hold the position for __________ seconds. 4. Slowly lower your leg to the starting position. 5. Allow your muscles to relax completely before you start the next repetition. Repeat __________ times. Complete this exercise __________ times a day. Bridge This exercise strengthens the muscles in your buttocks and the back of your thighs (hip extensors). 1. Lie on your back on a firm surface with your knees bent and your feet flat on the floor. 2. Tighten your buttocks muscles and lift your bottom off the floor until the trunk of your body is level with your thighs. ? You should feel the muscles working in your buttocks and the back of your thighs. ? Do not arch your back. 3. Hold this position for __________ seconds. 4. Slowly lower your hips to the starting position. 5. Let your buttocks muscles relax  completely between repetitions. 6. If told by your health care provider, keep your bottom lifted off the floor while you slowly walk your feet away from you  as far as you can control. Hold for __________ seconds, then slowly walk your feet back toward you. Repeat __________ times. Complete this exercise __________ times a day. Lateral walking with band This is an exercise in which you walk sideways (lateral), with tension provided by an exercise band. The exercise strengthens the muscles in your hip (hip abductors). 1. Stand in a long hallway. 2. Wrap a loop of exercise band around your legs, just above your knees. 3. Bend your knees gently and drop your hips down and back so your weight is over your heels. 4. Step to the side to move down the length of the hallway, keeping your toes pointed ahead of you and keeping tension in the band. 5. Repeat, leading with your other leg. Repeat __________ times. Complete this exercise __________ times a day. Single leg stand with reaching This exercise is also called eccentric hamstring stretch. 1. Stand on your left / right foot. Keep your big toe down on the floor and try to keep your arch lifted. 2. Slowly reach down toward the floor as far as you can while keeping your balance. Lowering your thigh under tension is called eccentric stretching. 3. Hold this position for __________ seconds. Repeat __________ times. Complete this exercise __________ times a day. Plank, prone This exercise strengthens muscles in your abdomen and core area. 1. Lie on your abdomen on the floor (prone position),and prop yourself up on your elbows. Your hands should be straight out in front of you, and your elbows should be below your shoulders. Position your feet similar to a push-up position so your toes are on the ground. 2. Tighten your abdominal muscles and lift your body off the floor. ? Do not arch your back. ? Do not hold your breath. 3. Hold this position for  __________ seconds. Repeat __________ times. Complete this exercise __________ times a day. This information is not intended to replace advice given to you by your health care provider. Make sure you discuss any questions you have with your health care provider. Document Revised: 09/26/2018 Document Reviewed: 06/03/2018 Elsevier Patient Education  [place].

## 2020-03-16 NOTE — Progress Notes (Signed)
Subjective:    Patient ID: Tammy Humphrey, female    DOB: [birth year], 69 y.o.   MRN: [id number]  HPI  Patient is a 69 year old female with hypothyroidism and osteoporosis who presents to the clinic with new bilateral buttock/hip pain and just feeling achy.  She is concerned because her mother had hip osteoarthritis.  Symptoms started 1 month ago and have not seemed to improve.  Is mostly held in the back of her buttock and radiates down the back of her leg into her knee posterior knee.  She is concerned because she got her second Prolia injection on 11/20/2019.  She had no complications with her first injection.  She has had no other medication starts.  She denies any injuries.  She denies any numbness or tingling going down her leg.  She does notice that bending, twisting does exacerbate the pain.  She has not really tried anything to make better.  She denies any leg weakness, saddle anesthesia, bowel or bladder dysfunction.  .. Active Ambulatory Problems    Diagnosis Date Noted  . Esophageal reflux 08/03/2014  . Thyroid activity decreased 08/03/2014  . Osteoporosis 08/03/2014  . Hyperlipidemia 08/07/2014  . IDA (iron deficiency anemia) 08/07/2014  . Skin abrasion 03/04/2018  . Arthralgia 03/16/2020  . Hamstring muscle strain 03/16/2020  . Neck pain 03/16/2020   Resolved Ambulatory Problems    Diagnosis Date Noted  . No Resolved Ambulatory Problems   Past Medical History:  Diagnosis Date  . Thyroid disease       Review of Systems See HPI.     Objective:   Physical Exam Cardiovascular:     Rate and Rhythm: Normal rate and regular rhythm.  Pulmonary:     Effort: Pulmonary effort is normal.  Musculoskeletal:     Right lower leg: No edema.     Left lower leg: No edema.     Comments: NROM at waist. Normal external ROM. Some discomfort with internal rotation.  Negative SLR, bilaterally. Very tight hamstrings. Bilateral pain at insertion of hamstrings in the buttocks.  No  tenderness over lumbar spine or greater trochanter.  Full ROM bilateral knees.    Neurological:     General: No focal deficit present.     Mental Status: She is oriented to person, place, and time.  Psychiatric:        Mood and Affect: Mood normal.           Assessment & Plan:  Marland KitchenMarland KitchenMarceil was seen today for joint pain.  Diagnoses and all orders for this visit:  Arthralgia, unspecified joint -     COMPLETE METABOLIC PANEL WITH GFR -     Cancel: DG Hip Unilat W OR W/O Pelvis 2-3 Views Left -     Cancel: DG Hip Unilat W OR W/O Pelvis 2-3 Views Right -     TSH -     Sed Rate (ESR) -     CBC with Differential/Platelet -     DG HIPS BILAT WITH PELVIS MIN 5 VIEWS  Hamstring muscle strain, unspecified laterality, initial encounter -     COMPLETE METABOLIC PANEL WITH GFR -     Sed Rate (ESR)  Neck pain -     COMPLETE METABOLIC PANEL WITH GFR -     Sed Rate (ESR)  Hypothyroidism, unspecified type -     TSH -     levothyroxine (SYNTHROID) 75 MCG tablet; Take 1 tablet (75 mcg total) by mouth daily before breakfast.  Iron deficiency  anemia secondary to inadequate dietary iron intake -     TSH  Bilateral hip pain -     DG HIPS BILAT WITH PELVIS MIN 5 VIEWS   Physical exam findings demonstrate some hamstring tightness and strain bilaterally.  I did give her some exercises to start doing.  Encouraged good stretching before exercise or activity.  Due to her head age and some discomfort with internal range of motion will get bilateral hip x-rays to rule out beginning degeneration.consider ibuprofen as needed for symptoms/pain. Consider a good massage.  Patient did recently have her second Prolia injection.  Would like to get a CMP to rule out any hypocalcemia due to injection. Pt is taking calcium with prolia. Patient has a history of hypothyroidism.  Ordered thyroid for recheck.

## 2020-03-17 LAB — COMPLETE METABOLIC PANEL WITH GFR
AG Ratio: 1.5 (calc) (ref 1.0–2.5)
ALT: 18 U/L (ref 6–29)
AST: 16 U/L (ref 10–35)
Albumin: 3.8 g/dL (ref 3.6–5.1)
Alkaline phosphatase (APISO): 63 U/L (ref 37–153)
BUN: 14 mg/dL (ref 7–25)
CO2: 27 mmol/L (ref 20–32)
Calcium: 8.6 mg/dL (ref 8.6–10.4)
Chloride: 106 mmol/L (ref 98–110)
Creat: 0.83 mg/dL (ref 0.50–0.99)
GFR, Est African American: 84 mL/min/{1.73_m2} (ref >=60)
GFR, Est Non African American: 72 mL/min/{1.73_m2} (ref >=60)
Globulin: 2.5 g/dL (calc) (ref 1.9–3.7)
Glucose, Bld: 102 mg/dL (ref 65–139)
Potassium: 4.3 mmol/L (ref 3.5–5.3)
Sodium: 141 mmol/L (ref 135–146)
Total Bilirubin: 0.6 mg/dL (ref 0.2–1.2)
Total Protein: 6.3 g/dL (ref 6.1–8.1)

## 2020-03-17 LAB — CBC WITH DIFFERENTIAL/PLATELET
Absolute Monocytes: 680 cells/uL (ref 200–950)
Basophils Absolute: 48 cells/uL (ref 0–200)
Basophils Relative: 0.6 %
Eosinophils Absolute: 80 cells/uL (ref 15–500)
Eosinophils Relative: 1 %
HCT: 38.5 % (ref 35.0–45.0)
Hemoglobin: 12.9 g/dL (ref 11.7–15.5)
Lymphs Abs: 1688 cells/uL (ref 850–3900)
MCH: 31.9 pg (ref 27.0–33.0)
MCHC: 33.5 g/dL (ref 32.0–36.0)
MCV: 95.1 fL (ref 80.0–100.0)
MPV: 9.4 fL (ref 7.5–12.5)
Monocytes Relative: 8.5 %
Neutro Abs: 5504 cells/uL (ref 1500–7800)
Neutrophils Relative %: 68.8 %
Platelets: 389 10*3/uL (ref 140–400)
RBC: 4.05 10*6/uL (ref 3.80–5.10)
RDW: 11.6 % (ref 11.0–15.0)
Total Lymphocyte: 21.1 %
WBC: 8 10*3/uL (ref 3.8–10.8)

## 2020-03-17 LAB — TSH: TSH: 2.5 mIU/L (ref 0.40–4.50)

## 2020-03-17 LAB — SEDIMENTATION RATE: Sed Rate: 62 mm/h — ABNORMAL HIGH (ref 0–30)

## 2020-03-17 NOTE — Progress Notes (Signed)
Chrystel,   Kidney function looks GREAT! GFR is 72. Thyroid is perfect. Hemoglobin is good. Calcium is normal range but on the low side of your baseline I would increase your calcium intake in foods daily and stay on your calcium supplement. CRP is a marker of inflammation and elevated. It is a non-specific marker.

## 2020-03-19 MED ORDER — MELOXICAM 7.5 MG PO TABS: ORAL_TABLET | ORAL | 0 refills | Status: DC

## 2020-03-19 MED ORDER — LEVOTHYROXINE SODIUM 75 MCG PO TABS: 75.0000 ug | ORAL_TABLET | Freq: Every day | ORAL | 3 refills | Status: DC

## 2020-03-19 NOTE — Progress Notes (Signed)
Tammy Humphrey,   Your hip xrays look good. I think your pain is coming from your hamstring. Continue treatment plan discussed if no improvement in 2 weeks. I would like to get your in with physical therapy.

## 2020-04-03 ENCOUNTER — Other Ambulatory Visit: Payer: Self-pay | Admitting: Physician Assistant

## 2020-04-13 ENCOUNTER — Other Ambulatory Visit: Payer: Self-pay | Admitting: Physician Assistant

## 2020-04-13 DIAGNOSIS — E039 Hypothyroidism, unspecified: Secondary | ICD-10-CM

## 2020-05-11 ENCOUNTER — Telehealth: Payer: Self-pay

## 2020-05-11 DIAGNOSIS — M810 Age-related osteoporosis without current pathological fracture: Secondary | ICD-10-CM

## 2020-05-11 NOTE — Telephone Encounter (Signed)
PA not required for Prolia through [facility].  Spoke with pt regarding lab work and she wants to hold off on getting the shot right now so her nurse visit has been cancelled and I advised her to get her labs drawn before rescheduling.

## 2020-05-11 NOTE — Telephone Encounter (Signed)
Tammy Humphrey is scheduled for a Prolia injection December 3rd. She will need lab work and a PA prior to visit.

## 2020-05-21 ENCOUNTER — Ambulatory Visit: Payer: PPO

## 2020-06-21 DIAGNOSIS — L820 Inflamed seborrheic keratosis: Secondary | ICD-10-CM | POA: Diagnosis not present

## 2020-06-21 DIAGNOSIS — D485 Neoplasm of uncertain behavior of skin: Secondary | ICD-10-CM | POA: Diagnosis not present

## 2020-11-02 DIAGNOSIS — Z1231 Encounter for screening mammogram for malignant neoplasm of breast: Secondary | ICD-10-CM | POA: Diagnosis not present

## 2020-11-02 DIAGNOSIS — Z7689 Persons encountering health services in other specified circumstances: Secondary | ICD-10-CM | POA: Diagnosis not present

## 2020-11-27 ENCOUNTER — Emergency Department (INDEPENDENT_AMBULATORY_CARE_PROVIDER_SITE_OTHER)
Admission: EM | Admit: 2020-11-27 | Discharge: 2020-11-27 | Disposition: A | Discharge status: Home / Self Care | Payer: PPO | Source: Home / Self Care

## 2020-11-27 ENCOUNTER — Emergency Department: Admit: 2020-11-27 | Payer: PPO | Source: Home / Self Care

## 2020-11-27 ENCOUNTER — Encounter: Payer: Self-pay | Admitting: Emergency Medicine

## 2020-11-27 DIAGNOSIS — S0341XA Sprain of jaw, right side, initial encounter: Secondary | ICD-10-CM

## 2020-11-27 DIAGNOSIS — S0342XA Sprain of jaw, left side, initial encounter: Secondary | ICD-10-CM | POA: Diagnosis not present

## 2020-11-27 DIAGNOSIS — S0340XA Sprain of jaw, unspecified side, initial encounter: Secondary | ICD-10-CM

## 2020-11-27 MED ORDER — PREDNISONE 20 MG PO TABS: ORAL_TABLET | ORAL | 0 refills | Status: DC

## 2020-11-27 MED ORDER — METHYLPREDNISOLONE SODIUM SUCC 125 MG IJ SOLR
125.0000 mg | Freq: Once | INTRAMUSCULAR | Status: AC
  Administered 2020-11-27: 11:00:00 125 mg via INTRAMUSCULAR

## 2020-11-27 NOTE — ED Triage Notes (Signed)
Patient c/o either an ear infection in her left ear or possible TMJ.  Patient has been having pain since Thursday, extremely painful.  Patient has been taken Ibuprofen w/minimal relief.  Patient is vaccinated for COVID.

## 2020-11-27 NOTE — ED Provider Notes (Signed)
Tammy Humphrey CARE    CSN: [id number] Arrival date & time: 11/27/20  0920      History   Chief Complaint Chief Complaint  Patient presents with   Otalgia    HPI Tammy Humphrey is a 70 y.o. female.   HPI 70 year old female presents with left otalgia and TMJ for 3 days.  Reports pain is extremely painful.  Patient reports taking OTC ibuprofen with little to no relief.  Past Medical History:  Diagnosis Date   Thyroid disease     Patient Active Problem List   Diagnosis Date Noted   Arthralgia 03/16/2020   Hamstring muscle strain 03/16/2020   Neck pain 03/16/2020   Skin abrasion 03/04/2018   Hyperlipidemia 08/07/2014   IDA (iron deficiency anemia) 08/07/2014   Esophageal reflux 08/03/2014   Thyroid activity decreased 08/03/2014   Osteoporosis 08/03/2014    History reviewed. No pertinent surgical history.  OB History   No obstetric history on file.      Home Medications    Prior to Admission medications   Medication Sig Start Date End Date Taking? Authorizing Provider  Calcium 600-200 MG-UNIT tablet Take 1 tablet by mouth 2 (two) times daily.   Yes [provider]  levothyroxine (SYNTHROID) 75 MCG tablet Take 1 tablet (75 mcg total) by mouth daily before breakfast. 03/19/20  Yes Breeback, Jade L, PA-C  meloxicam (MOBIC) 7.5 MG tablet Take 1-2 tablets as needed once a day for hamstring pain. 03/19/20  Yes Breeback, Jade L, PA-C  predniSONE (DELTASONE) 20 MG tablet Take 3 tabs PO daily x 5 days. 11/27/20  Yes Eliezer Lofts, FNP  denosumab (PROLIA) 60 MG/ML SOSY injection Inject 60 mg into the skin every 6 (six) months. Administer in upper arm, thigh, or abdomen 11/10/19   Donella Stade, PA-C    Family History Family History  Problem Relation Age of Onset   Hypertension Mother    Heart attack Father    Stroke Father    Heart attack Paternal Grandmother     Social History Social History   Tobacco Use   Smoking status: Never   Smokeless  tobacco: Never  Vaping Use   Vaping Use: Never used  Substance Use Topics   Alcohol use: No    Alcohol/week: 0.0 standard drinks   Drug use: No     Allergies   Patient has no known allergies.   Review of Systems Review of Systems  Constitutional: Negative.   HENT:  Positive for ear pain.        TMJ/jaw pain  Eyes: Negative.   Respiratory: Negative.    Cardiovascular: Negative.   Gastrointestinal: Negative.   Musculoskeletal: Negative.   Skin: Negative.   Neurological: Negative.     Physical Exam Triage Vital Signs ED Triage Vitals  Enc Vitals Group     BP 11/27/20 1003 111/73     Pulse Rate 11/27/20 1003 91     Resp --      Temp 11/27/20 1003 99.3 F (37.4 C)     Temp Source 11/27/20 1003 Oral     SpO2 11/27/20 1003 97 %     Weight --      Height --      Head Circumference --      Peak Flow --      Pain Score 11/27/20 1004 7     Pain Loc --      Pain Edu? --      Excl. in [place]? --  No data found.  Updated Vital Signs BP 111/73 (BP Location: Right Arm)   Pulse 91   Temp 99.3 F (37.4 C) (Oral)   SpO2 97%   Physical Exam Vitals and nursing note reviewed.  Constitutional:      General: She is not in acute distress.    Appearance: Normal appearance. She is normal weight. She is not ill-appearing.  HENT:     Head: Normocephalic and atraumatic.     Right Ear: Tympanic membrane, ear canal and external ear normal.     Left Ear: Tympanic membrane, ear canal and external ear normal.     Nose: Nose normal.     Mouth/Throat:     Mouth: Mucous membranes are moist.     Pharynx: Oropharynx is clear.     Comments: TMJ noted bilaterally with audible click, patient has limited range of motion when opening her mouth, TTP and reports moderate pain when opening her mouth  Eyes:     Extraocular Movements: Extraocular movements intact.     Conjunctiva/sclera: Conjunctivae normal.     Pupils: Pupils are equal, round, and reactive to light.  Cardiovascular:      Rate and Rhythm: Normal rate and regular rhythm.     Pulses: Normal pulses.     Heart sounds: Normal heart sounds.  Pulmonary:     Effort: Pulmonary effort is normal. No respiratory distress.     Breath sounds: Normal breath sounds. No wheezing, rhonchi or rales.  Musculoskeletal:     Cervical back: Normal range of motion and neck supple. No tenderness.  Lymphadenopathy:     Cervical: No cervical adenopathy.  Skin:    General: Skin is warm and dry.  Neurological:     General: No focal deficit present.     Mental Status: She is alert and oriented to person, place, and time.  Psychiatric:        Mood and Affect: Mood normal.        Behavior: Behavior normal.     UC Treatments / Results  Labs (all labs ordered are listed, but only abnormal results are displayed) Labs Reviewed - No data to display  EKG   Radiology No results found.  Procedures Procedures (including critical care time)  Medications Ordered in UC Medications  methylPREDNISolone sodium succinate (SOLU-MEDROL) 125 mg/2 mL injection 125 mg (125 mg Intramuscular Given 11/27/20 1050)    Initial Impression / Assessment and Plan / UC Course  I have reviewed the triage vital signs and the nursing notes.  Pertinent labs & imaging results that were available during my care of the patient were reviewed by me and considered in my medical decision making (see chart for details).    MDM: 1. TMJ.  Patient discharged home, hemodynamically stable. Final Clinical Impressions(s) / UC Diagnoses   Final diagnoses:  TMJ (sprain of temporomandibular joint), initial encounter     Discharge Instructions      Advised patient to start oral prednisone burst at 2 PM today.  Advised/encouraged patient to take medication with food to completion.  Encourage patient decrease daily water intake while taking this medication.     ED Prescriptions     Medication Sig Dispense Auth. Provider   predniSONE (DELTASONE) 20 MG tablet  Take 3 tabs PO daily x 5 days. 15 tablet Eliezer Lofts, FNP      PDMP not reviewed this encounter.   Eliezer Lofts, [place] 11/27/20 1055

## 2020-11-27 NOTE — Discharge Instructions (Addendum)
Advised patient to start oral prednisone burst at 2 PM today.  Advised/encouraged patient to take medication with food to completion.  Encourage patient decrease daily water intake while taking this medication.

## 2021-01-03 DIAGNOSIS — L821 Other seborrheic keratosis: Secondary | ICD-10-CM | POA: Diagnosis not present

## 2021-01-03 DIAGNOSIS — L718 Other rosacea: Secondary | ICD-10-CM | POA: Diagnosis not present

## 2021-01-03 DIAGNOSIS — L570 Actinic keratosis: Secondary | ICD-10-CM | POA: Diagnosis not present

## 2021-01-03 DIAGNOSIS — L820 Inflamed seborrheic keratosis: Secondary | ICD-10-CM | POA: Diagnosis not present

## 2021-01-03 DIAGNOSIS — L814 Other melanin hyperpigmentation: Secondary | ICD-10-CM | POA: Diagnosis not present

## 2021-01-03 DIAGNOSIS — X32XXXS Exposure to sunlight, sequela: Secondary | ICD-10-CM | POA: Diagnosis not present

## 2021-02-16 ENCOUNTER — Encounter: Payer: Self-pay | Admitting: Physician Assistant

## 2021-02-16 DIAGNOSIS — M810 Age-related osteoporosis without current pathological fracture: Secondary | ICD-10-CM

## 2021-03-07 DIAGNOSIS — H35372 Puckering of macula, left eye: Secondary | ICD-10-CM | POA: Diagnosis not present

## 2021-03-07 DIAGNOSIS — H04123 Dry eye syndrome of bilateral lacrimal glands: Secondary | ICD-10-CM | POA: Diagnosis not present

## 2021-03-07 DIAGNOSIS — H524 Presbyopia: Secondary | ICD-10-CM | POA: Diagnosis not present

## 2021-03-07 DIAGNOSIS — H40013 Open angle with borderline findings, low risk, bilateral: Secondary | ICD-10-CM | POA: Diagnosis not present

## 2021-03-09 ENCOUNTER — Other Ambulatory Visit: Payer: Self-pay

## 2021-03-09 ENCOUNTER — Ambulatory Visit (INDEPENDENT_AMBULATORY_CARE_PROVIDER_SITE_OTHER): Payer: PPO

## 2021-03-09 DIAGNOSIS — M8589 Other specified disorders of bone density and structure, multiple sites: Secondary | ICD-10-CM | POA: Diagnosis not present

## 2021-03-09 DIAGNOSIS — M810 Age-related osteoporosis without current pathological fracture: Secondary | ICD-10-CM | POA: Diagnosis not present

## 2021-03-11 NOTE — Progress Notes (Signed)
Your bone density showed in the osteopenia range and stable from 2020. Continue vitamin D, Calcium and prolia. Recheck in 2 years.

## 2021-04-06 ENCOUNTER — Other Ambulatory Visit: Payer: Self-pay

## 2021-04-06 DIAGNOSIS — E039 Hypothyroidism, unspecified: Secondary | ICD-10-CM

## 2021-04-06 MED ORDER — LEVOTHYROXINE SODIUM 75 MCG PO TABS: 75.0000 ug | ORAL_TABLET | Freq: Every day | ORAL | 0 refills | Status: DC

## 2021-05-07 ENCOUNTER — Other Ambulatory Visit: Payer: Self-pay | Admitting: Physician Assistant

## 2021-05-07 DIAGNOSIS — E039 Hypothyroidism, unspecified: Secondary | ICD-10-CM

## 2021-05-09 ENCOUNTER — Other Ambulatory Visit: Payer: Self-pay | Admitting: Physician Assistant

## 2021-05-09 ENCOUNTER — Encounter: Payer: Self-pay | Admitting: Physician Assistant

## 2021-05-09 ENCOUNTER — Other Ambulatory Visit: Payer: Self-pay

## 2021-05-09 DIAGNOSIS — R7989 Other specified abnormal findings of blood chemistry: Secondary | ICD-10-CM

## 2021-05-09 DIAGNOSIS — E039 Hypothyroidism, unspecified: Secondary | ICD-10-CM

## 2021-05-09 DIAGNOSIS — E785 Hyperlipidemia, unspecified: Secondary | ICD-10-CM

## 2021-05-09 DIAGNOSIS — D508 Other iron deficiency anemias: Secondary | ICD-10-CM

## 2021-05-10 DIAGNOSIS — R7989 Other specified abnormal findings of blood chemistry: Secondary | ICD-10-CM | POA: Diagnosis not present

## 2021-05-10 DIAGNOSIS — D508 Other iron deficiency anemias: Secondary | ICD-10-CM | POA: Diagnosis not present

## 2021-05-10 DIAGNOSIS — E785 Hyperlipidemia, unspecified: Secondary | ICD-10-CM | POA: Diagnosis not present

## 2021-05-10 DIAGNOSIS — E039 Hypothyroidism, unspecified: Secondary | ICD-10-CM | POA: Diagnosis not present

## 2021-05-11 ENCOUNTER — Other Ambulatory Visit: Payer: Self-pay | Admitting: Physician Assistant

## 2021-05-11 DIAGNOSIS — E039 Hypothyroidism, unspecified: Secondary | ICD-10-CM

## 2021-05-11 LAB — CBC WITH DIFFERENTIAL/PLATELET
Absolute Monocytes: 513 cells/uL (ref 200–950)
Basophils Absolute: 41 cells/uL (ref 0–200)
Basophils Relative: 0.7 %
Eosinophils Absolute: 142 cells/uL (ref 15–500)
Eosinophils Relative: 2.4 %
HCT: 42.9 % (ref 35.0–45.0)
Hemoglobin: 14.3 g/dL (ref 11.7–15.5)
Lymphs Abs: 1693 cells/uL (ref 850–3900)
MCH: 32 pg (ref 27.0–33.0)
MCHC: 33.3 g/dL (ref 32.0–36.0)
MCV: 96 fL (ref 80.0–100.0)
MPV: 9.9 fL (ref 7.5–12.5)
Monocytes Relative: 8.7 %
Neutro Abs: 3511 cells/uL (ref 1500–7800)
Neutrophils Relative %: 59.5 %
Platelets: 364 10*3/uL (ref 140–400)
RBC: 4.47 10*6/uL (ref 3.80–5.10)
RDW: 11.7 % (ref 11.0–15.0)
Total Lymphocyte: 28.7 %
WBC: 5.9 10*3/uL (ref 3.8–10.8)

## 2021-05-11 LAB — TSH: TSH: 3.36 mIU/L (ref 0.40–4.50)

## 2021-05-11 LAB — COMPREHENSIVE METABOLIC PANEL
AG Ratio: 2 (calc) (ref 1.0–2.5)
ALT: 13 U/L (ref 6–29)
AST: 16 U/L (ref 10–35)
Albumin: 4.1 g/dL (ref 3.6–5.1)
Alkaline phosphatase (APISO): 60 U/L (ref 37–153)
BUN: 13 mg/dL (ref 7–25)
CO2: 29 mmol/L (ref 20–32)
Calcium: 9.4 mg/dL (ref 8.6–10.4)
Chloride: 106 mmol/L (ref 98–110)
Creat: 0.95 mg/dL (ref 0.50–1.05)
Globulin: 2.1 g/dL (calc) (ref 1.9–3.7)
Glucose, Bld: 95 mg/dL (ref 65–99)
Potassium: 5.1 mmol/L (ref 3.5–5.3)
Sodium: 141 mmol/L (ref 135–146)
Total Bilirubin: 1.1 mg/dL (ref 0.2–1.2)
Total Protein: 6.2 g/dL (ref 6.1–8.1)

## 2021-05-11 LAB — LIPID PANEL
Cholesterol: 247 mg/dL — ABNORMAL HIGH (ref <200)
HDL: 89 mg/dL (ref >=50)
LDL Cholesterol (Calc): 140 mg/dL (calc) — ABNORMAL HIGH
Non-HDL Cholesterol (Calc): 158 mg/dL (calc) — ABNORMAL HIGH (ref <130)
Total CHOL/HDL Ratio: 2.8 (calc) (ref <5.0)
Triglycerides: 85 mg/dL (ref <150)

## 2021-05-11 NOTE — Progress Notes (Signed)
Thyroid is in normal range but upper limits. Have you been feeling in low thyroid symptoms or feel good at current dose?   Kidney, liver, glucose look great.   Cholesterol has increased some. Will talk about at upcoming appt.

## 2021-05-14 ENCOUNTER — Encounter: Payer: Self-pay | Admitting: Physician Assistant

## 2021-05-18 ENCOUNTER — Encounter: Payer: Self-pay | Admitting: Physician Assistant

## 2021-05-18 ENCOUNTER — Other Ambulatory Visit: Payer: Self-pay

## 2021-05-18 ENCOUNTER — Ambulatory Visit (INDEPENDENT_AMBULATORY_CARE_PROVIDER_SITE_OTHER): Payer: PPO | Admitting: Physician Assistant

## 2021-05-18 VITALS — BP 123/82 | HR 83 | Wt 138.2 lb

## 2021-05-18 DIAGNOSIS — E039 Hypothyroidism, unspecified: Secondary | ICD-10-CM | POA: Diagnosis not present

## 2021-05-18 DIAGNOSIS — E785 Hyperlipidemia, unspecified: Secondary | ICD-10-CM | POA: Diagnosis not present

## 2021-05-18 DIAGNOSIS — Z0000 Encounter for general adult medical examination without abnormal findings: Secondary | ICD-10-CM

## 2021-05-18 DIAGNOSIS — D508 Other iron deficiency anemias: Secondary | ICD-10-CM | POA: Diagnosis not present

## 2021-05-18 MED ORDER — LEVOTHYROXINE SODIUM 75 MCG PO TABS: 75.0000 ug | ORAL_TABLET | Freq: Every day | ORAL | 3 refills | Status: DC

## 2021-05-18 NOTE — Patient Instructions (Signed)
Turmeric 500mg  twice a day.

## 2021-05-18 NOTE — Progress Notes (Signed)
Subjective:    Patient ID: Tammy Humphrey, female    DOB: 1951-01-07, 70 y.o.   MRN: [id number]  HPI Pt is a 70 yo female with hypothyroidism who presents to the clinic for medication management and preventative health follow up.   Pt has stopped prolia because of joint pain that occurred after she started it.   Pt already has her labs drawn.  .. Active Ambulatory Problems    Diagnosis Date Noted   Esophageal reflux 08/03/2014   Thyroid activity decreased 08/03/2014   Osteoporosis 08/03/2014   Hyperlipidemia 08/07/2014   IDA (iron deficiency anemia) 08/07/2014   Skin abrasion 03/04/2018   Arthralgia 03/16/2020   Hamstring muscle strain 03/16/2020   Neck pain 03/16/2020   Resolved Ambulatory Problems    Diagnosis Date Noted   No Resolved Ambulatory Problems   Past Medical History:  Diagnosis Date   Thyroid disease    .Marland Kitchen Family History  Problem Relation Age of Onset   Hypertension Mother    Heart attack Father    Stroke Father    Heart attack Paternal Grandmother    .Marland Kitchen Social History   Socioeconomic History   Marital status: Widowed    Spouse name: Not on file   Number of children: 2   Years of education: 76   Highest education level: Some college, no degree  Occupational History   Occupation: Engineer, 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  Tobacco Use   Smoking status: Never   Smokeless tobacco: Never  Vaping Use   Vaping Use: Never used  Substance and Sexual Activity   Alcohol use: No    Alcohol/week: 0.0 standard drinks   Drug use: No   Sexual activity: Not Currently  Other Topics Concern   Not on file  Social History Narrative   Works a part time job   Research officer, 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     Objective:   Physical Exam Vitals reviewed.  Constitutional:      Appearance: Normal appearance.  HENT:     Head: Normocephalic.  Neck:     Vascular: No carotid bruit.  Cardiovascular:     Rate and Rhythm: Normal rate and regular rhythm.     Pulses: Normal pulses.     Heart sounds: Normal heart sounds.  Pulmonary:     Effort: Pulmonary effort is normal.     Breath sounds: Normal breath sounds.  Abdominal:     General: Bowel sounds are normal. There is no distension.     Palpations: Abdomen is soft. There is no mass.     Tenderness: There is no abdominal tenderness. There is no right CVA tenderness, left CVA tenderness, guarding or rebound.     Hernia: No hernia is present.  Musculoskeletal:     Cervical back: Normal range of motion and neck supple. No tenderness.     Right lower leg: No edema.     Left lower leg: No edema.  Lymphadenopathy:     Cervical: No cervical adenopathy.  Skin:    General: Skin is warm.     Findings: No rash.  Neurological:  General: No focal deficit present.     Mental Status: She is alert and oriented to person, place, and time.  Psychiatric:        Mood and Affect: Mood normal.  .. Depression screen [facility] 2/9 05/20/2021 01/29/2020 01/14/2019 03/04/2018  Decreased Interest 0 0 0 0  Down, Depressed, Hopeless 0 0 0 0  PHQ - 2 Score 0 0 0 0  Altered sleeping - - - 0  Tired, decreased energy - - - 0  Change in appetite - - - 0  Feeling bad or failure about yourself  - - - 0  Trouble concentrating - - - 0  Moving slowly or fidgety/restless - - - 0  Suicidal thoughts - - - 0  PHQ-9 Score - - - 0  Difficult doing work/chores - - - Not difficult at all   .Marland Kitchen          Assessment & Plan:  Marland KitchenMarland KitchenKeila was seen today for results.  Diagnoses and all orders for this visit:  Preventative health care  Hypothyroidism, unspecified type -     levothyroxine (SYNTHROID) 75 MCG tablet; Take  1 tablet (75 mcg total) by mouth daily before breakfast.  Iron deficiency anemia secondary to inadequate dietary iron intake  Hyperlipidemia, unspecified hyperlipidemia type  .Marland Kitchen Discussed 150 minutes of exercise a week.  Encouraged vitamin D 1000 units and Calcium 1300mg  or 4 servings of dairy a day.  PHQ no concerns.  Fasting labs reviewed.  Synthroid refilled.  CV 10 year risk 7.6 percent. Pt declined statin and aware of risk. Discussed diet changes.  Mammogram/bone density UTD.  Pt declines osteoporosis medication.  Colonoscopy UTD.  Shingrix/pneumonia/Flu UTD.  Covid vaccine x3. [place] for booster.  Follow up in 1 year.

## 2021-07-17 IMAGING — DX DG HIP (WITH OR WITHOUT PELVIS) 5+V BILAT
5 series · 5 of 5 positions shown · non-contrast
Comparison: None.

CLINICAL DATA: Bilateral hip pain

EXAM:
DG HIP (WITH OR WITHOUT PELVIS) 5+V BILAT

[pelvis ap]
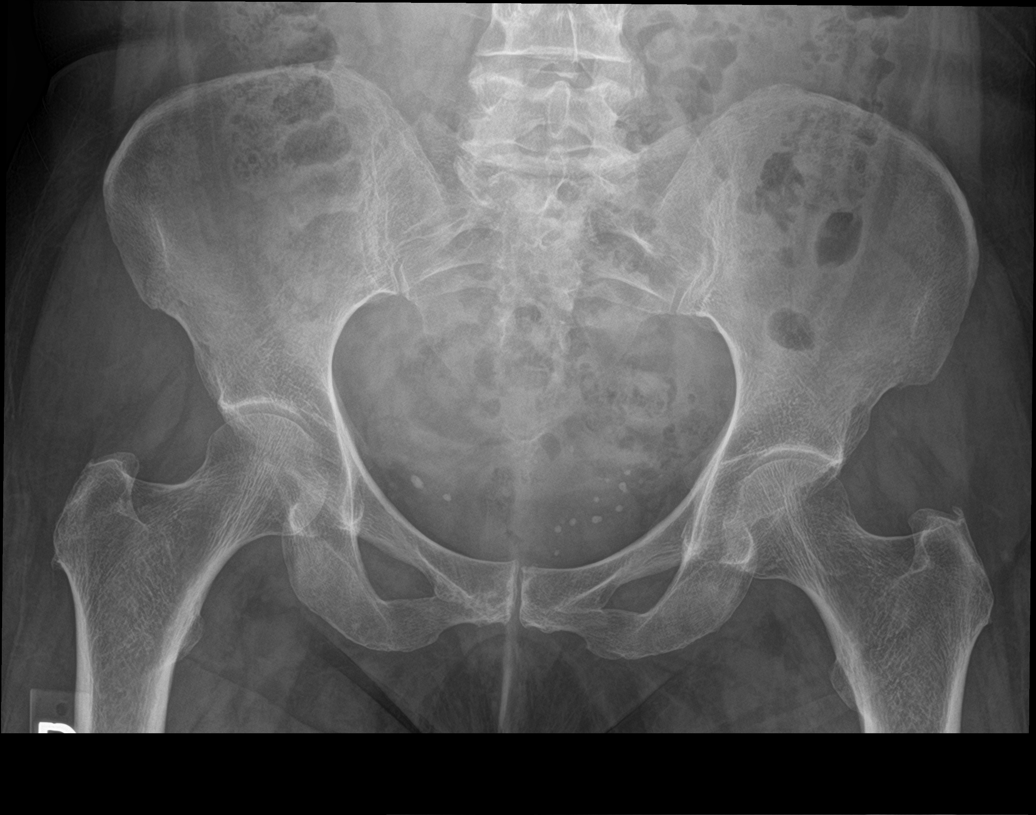

[hip ap (1 of 2)]
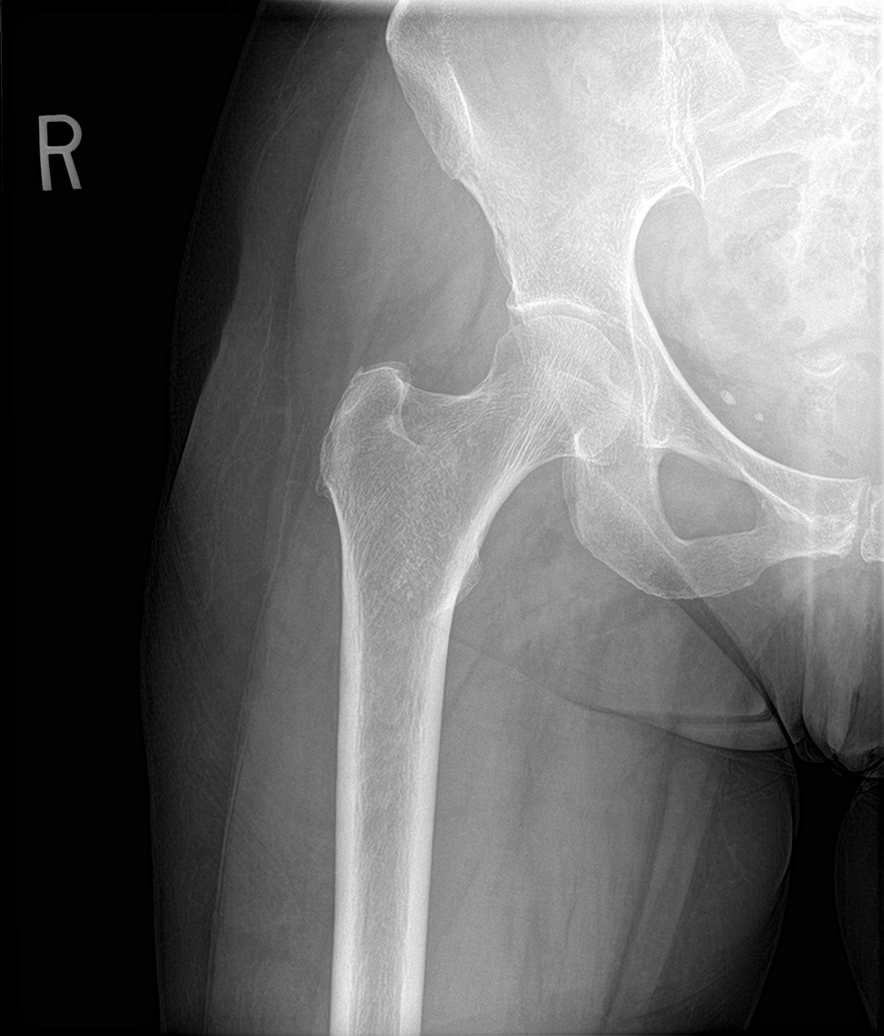

[hip lat (1 of 2)]
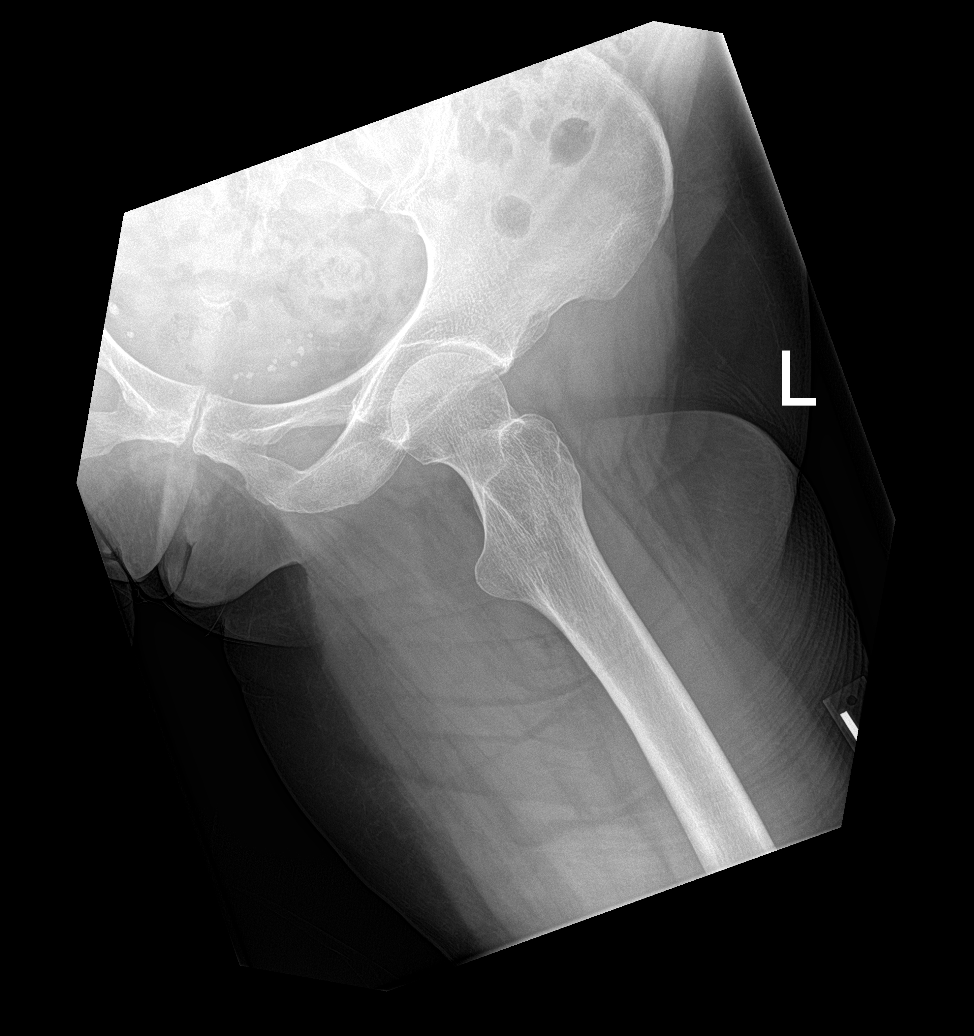

[hip ap (2 of 2)]
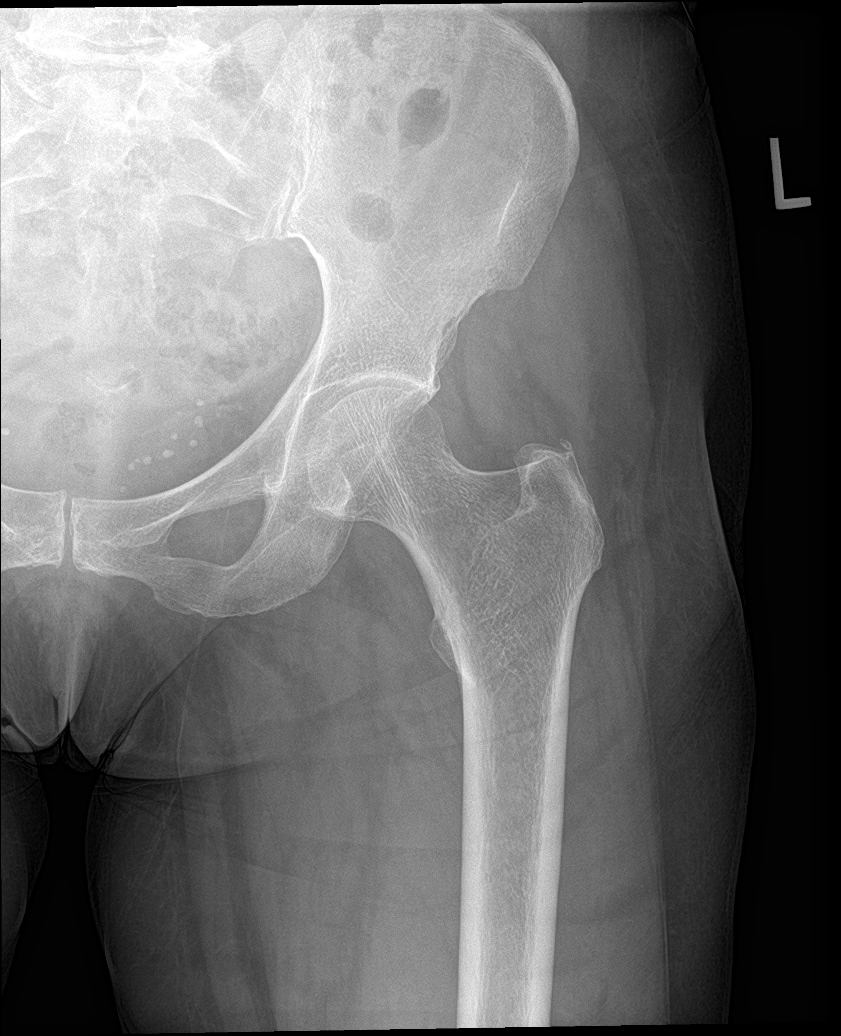

[hip lat (2 of 2)]
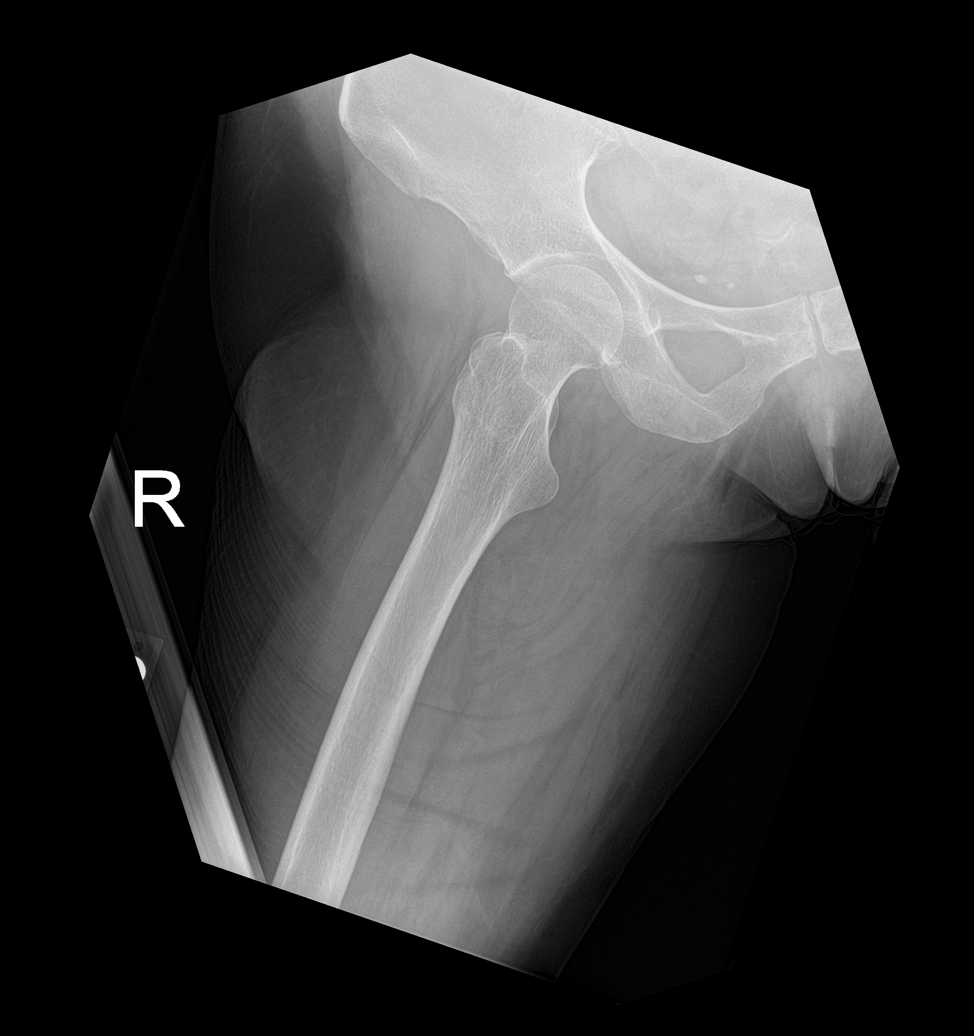

[5 of 5 positions shown; findings below may reference images not displayed]

FINDINGS: There is no evidence of hip fracture or dislocation. There is no
evidence of arthropathy or other focal bone abnormality.
IMPRESSION: Negative.

## 2021-07-22 ENCOUNTER — Ambulatory Visit (INDEPENDENT_AMBULATORY_CARE_PROVIDER_SITE_OTHER): Payer: PPO | Admitting: Physician Assistant

## 2021-07-22 ENCOUNTER — Other Ambulatory Visit: Payer: Self-pay

## 2021-07-22 VITALS — BP 105/74 | HR 94 | Ht 63.0 in | Wt 137.0 lb

## 2021-07-22 DIAGNOSIS — Z0000 Encounter for general adult medical examination without abnormal findings: Secondary | ICD-10-CM | POA: Diagnosis not present

## 2021-07-22 DIAGNOSIS — Z1211 Encounter for screening for malignant neoplasm of colon: Secondary | ICD-10-CM

## 2021-07-22 DIAGNOSIS — Z1212 Encounter for screening for malignant neoplasm of rectum: Secondary | ICD-10-CM

## 2021-07-22 NOTE — Progress Notes (Signed)
MEDICARE ANNUAL WELLNESS VISIT  07/22/2021  Subjective:  Tammy Humphrey is a 71 y.o. female patient of Donella Stade, PA-C who had a [employer] Visit today. Tammy Humphrey is Retired and lives with their son. she has 2 children. she reports that she is socially active and does interact with friends/family regularly. she is moderately physically active and enjoys walking.  Patient Care Team: Lavada Mesi as PCP - General (Family Medicine)  Advanced Directives 07/22/2021 01/29/2020 01/14/2019 08/03/2014  Does Patient Have a Medical Advance Directive? No No No Yes;No  Does patient want to make changes to medical advance directive? - - - No - Patient declined  Would patient like information on creating a medical advance directive? No - Patient declined Yes (MAU/Ambulatory/Procedural Areas - Information given) No - Patient declined -    Hospital Utilization Over the Past 12 Months: # of hospitalizations or ER visits: 0 # of surgeries: 0  Review of Systems    Patient reports that her overall health is unchanged when compared to last year.  Review of Systems: History obtained from chart review and the patient  All other systems negative.  Pain Assessment Pain : No/denies pain     Current Medications & Allergies (verified) Allergies as of 07/22/2021       Reactions   Prolia [denosumab]    Bone pain        Medication List        Accurate as of July 22, 2021  3:16 PM. If you have any questions, ask your nurse or doctor.          Calcium 600-200 MG-UNIT tablet Take 1 tablet by mouth 2 (two) times daily.   levothyroxine 75 MCG tablet Commonly known as: SYNTHROID Take 1 tablet (75 mcg total) by mouth daily before breakfast.   TURMERIC PO Take by mouth.        History (reviewed): Past Medical History:  Diagnosis Date   Osteopenia    Thyroid disease    History reviewed. No pertinent surgical history. 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 She enjoys walking and being outside.   Social Determinants of Health   Financial Resource Strain: Low Risk    Difficulty of Paying Living Expenses: Not hard at all  Food Insecurity: No Food Insecurity   Worried About Charity fundraiser in the Last Year: Never true   [place] in the Last Year: Never true  Transportation Needs: No Transportation Needs   Lack of Transportation (Medical): No   Lack of Transportation (Non-Medical): No  Physical Activity: Sufficiently Active  Days of Exercise per Week: 7 days   Minutes of Exercise per Session: 50 min  Stress: No Stress Concern Present   Feeling of Stress : Not at all  Social Connections: Moderately Isolated   Frequency of Communication with Friends and Family: More than three times a week   Frequency of Social Gatherings with Friends and Family: More than three times a week   Attends Religious Services: 1 to 4 times per year   Active Member of [employer] or Organizations: No   Attends Archivist Meetings: Never   Marital Status: Widowed    Activities of Daily Living In your present state of health, do you have any difficulty performing the following activities: 07/22/2021  Hearing? N  Vision? N  Difficulty  concentrating or making decisions? N  Walking or climbing stairs? N  Dressing or bathing? N  Doing errands, shopping? N  Preparing Food and eating ? N  Using the Toilet? N  In the past six months, have you accidently leaked urine? N  Do you have problems with loss of bowel control? N  Managing your Medications? N  Managing your Finances? N  Housekeeping or managing your Housekeeping? N  Some recent data might be hidden    Patient Education/Literacy How often do you need to have someone help you when you read instructions, pamphlets, or other written materials from your doctor or pharmacy?: 1 - Never What is the last grade level you completed in school?: 12th grade  Exercise Current Exercise Habits: Home exercise routine, Type of exercise: walking, Time (Minutes): 45, Frequency (Times/Week): 7, Weekly Exercise (Minutes/Week): 315, Intensity: Moderate, Exercise limited by: None identified  Diet Patient reports consuming 2 meals a day and 2 snack(s) a day Patient reports that her primary diet is: Regular Patient reports that she does have regular access to food.   Depression Screen PHQ 2/9 Scores 07/22/2021 05/20/2021 01/29/2020 01/14/2019 03/04/2018  PHQ - 2 Score 0 0 0 0 0  PHQ- 9 Score - - - - 0     Fall Risk Fall Risk  07/22/2021 01/29/2020 01/13/2020 05/13/2019 01/14/2019  Falls in the past year? 0 0 0 0 0  Comment - - [place] Telephone Survey: data to providers prior to load [employer] Telephone Survey: data to providers prior to load -  Number falls in past yr: 0 0 - - -  Injury with Fall? 0 - - - 0  Risk for fall due to : No Fall Risks - - - -  Follow up Education provided;Falls evaluation completed Falls evaluation completed - - Falls prevention discussed     Objective:   BP 105/74 (BP Location: Right Arm, Patient Position: Sitting, Cuff Size: Normal)    Pulse 94    Ht 5\' 3"  (1.6 m)    Wt 137 lb 0.6 oz (62.2 kg)    SpO2 97%    BMI 24.28 kg/m   Last Weight  Most recent update: 07/22/2021   3:00 PM    Weight  62.2 kg (137 lb 0.6 oz)             Body mass index is 24.28 kg/m.  Hearing/Vision  Tammy Humphrey did not have difficulty with hearing/understanding during the face-to-face interview Tammy Humphrey did not have difficulty with her vision during the face-to-face interview Reports that she has had a formal eye exam by an eye care professional within the past year Reports that she has not had a formal hearing evaluation within the past year  Cognitive  Function: 6CIT Screen 07/22/2021 01/29/2020 01/14/2019  What Year? 0 points 0 points 0 points  What month? 0 points 0 points 0 points  What time? 0 points 0 points 0 points  Count back from 20 0 points 0 points 0 points  Months in reverse 0 points 0 points 0 points  Repeat phrase 0 points 0 points 0 points  Total Score 0 0 0    Normal Cognitive Function Screening: Yes (Normal:0-7, Significant for Dysfunction: >8)  Immunization & Health Maintenance Record Immunization History  Administered Date(s) Administered   Influenza, High Dose Seasonal PF 04/11/2018, 04/16/2019   Influenza,inj,Quad PF,6+ Mos 04/11/2014   Influenza,inj,quad, With Preservative 04/01/2014   Influenza-Unspecified 05/03/2015, 04/06/2016, 04/19/2018, 04/21/2021   Moderna Sars-Covid-2 Vaccination 07/15/2019, 08/12/2019, 04/13/2020   Pneumococcal Conjugate-13 05/03/2015   Pneumococcal Polysaccharide-23 05/17/2016, 03/10/2020   Tdap 03/04/2018   Zoster Recombinat (Shingrix) 08/26/2020, 11/26/2020    Health Maintenance  Topic Date Due   COVID-19 Vaccine (4 - Booster for Moderna series) 08/07/2021 (Originally 06/08/2020)   Fecal DNA (Cologuard)  07/22/2022 (Originally 03/12/2021)   MAMMOGRAM  10/29/2021   DEXA SCAN  03/09/2024   TETANUS/TDAP  03/04/2028   Pneumonia Vaccine 50+ Years old  Completed   INFLUENZA VACCINE  Completed   Hepatitis C Screening  Completed   Zoster Vaccines- Shingrix  Completed   HPV VACCINES  Aged Out   COLONOSCOPY (Pts 45-82yrs  Insurance coverage will need to be confirmed)  Discontinued       Assessment  This is a routine wellness examination for [employer].  Health Maintenance: Due or Overdue There are no preventive care reminders to display for this patient.   Verita Lamb does not need a referral for [employer] Assistance: Care Management:   no Social Work:    no Prescription Assistance:  no Nutrition/Diabetes Education:  no   Plan:  Personalized Goals  Goals Addressed             This Visit's Progress    Patient Stated         Personalized Health Maintenance & Screening Recommendations  Colorectal cancer screening Mammogram - Patient had it done at her GYN office.  Lung Cancer Screening Recommended: no (Low Dose CT Chest recommended if Age 36-80 years, 30 pack-year currently smoking OR have quit w/in past 15 years) Hepatitis C Screening recommended: no HIV Screening recommended: no  Advanced Directives: Written information was not given per the patient's request.  Referrals & Orders Orders Placed This Encounter  Procedures   Cologuard    Follow-up Plan Follow-up with Donella Stade, PA-C as planned Please get [place] a copy of your mammogram results from 2022.  Cologuard referral has been sent.  Medicare wellness visit in one year.  AVS printed and mailed given to patient.   I have personally reviewed and noted the following in the patients chart:   Medical and social history Use of alcohol, tobacco or illicit drugs  Current medications and supplements Functional ability and status Nutritional status Physical activity Advanced directives List of other physicians Hospitalizations, surgeries, and ER visits in previous 12 months Vitals Screenings to include cognitive, depression, and falls Referrals and appointments  In addition, I have reviewed and discussed with patient certain preventive protocols, quality metrics, and best practice recommendations. A written  personalized care plan for preventive services as well as general preventive health recommendations were provided to patient.     Tinnie Gens, RN  07/22/2021

## 2021-07-22 NOTE — Patient Instructions (Addendum)
[address] Maintenance Summary and Written Plan of Care  Tammy Humphrey ,  Thank you for allowing me to perform your Medicare Annual Wellness Visit and for your ongoing commitment to your health.   Health Maintenance & Immunization History Health Maintenance  Topic Date Due   COVID-19 Vaccine (4 - Booster for Moderna series) 08/07/2021 (Originally 06/08/2020)   Fecal DNA (Cologuard)  07/22/2022 (Originally 03/12/2021)   MAMMOGRAM  10/29/2021   DEXA SCAN  03/09/2024   TETANUS/TDAP  03/04/2028   Pneumonia Vaccine 65+ Years old  Completed   INFLUENZA VACCINE  Completed   Hepatitis C Screening  Completed   Zoster Vaccines- Shingrix  Completed   HPV VACCINES  Aged Out   COLONOSCOPY (Pts 45-46yrs Insurance coverage will need to be confirmed)  Discontinued   Immunization History  Administered Date(s) Administered   Influenza, High Dose Seasonal PF 04/11/2018, 04/16/2019   Influenza,inj,Quad PF,6+ Mos 04/11/2014   Influenza,inj,quad, With Preservative 04/01/2014   Influenza-Unspecified 05/03/2015, 04/06/2016, 04/19/2018, 04/21/2021   Moderna Sars-Covid-2 Vaccination 07/15/2019, 08/12/2019, 04/13/2020   Pneumococcal Conjugate-13 05/03/2015   Pneumococcal Polysaccharide-23 05/17/2016, 03/10/2020   Tdap 03/04/2018   Zoster Recombinat (Shingrix) 08/26/2020, 11/26/2020    These are the patient goals that we discussed:  Goals Addressed             This Visit's Progress    Patient Stated           This is a list of Health Maintenance Items that are overdue or due now: Colorectal cancer screening Mammogram - Patient had it done at her GYN office.    Orders/Referrals Placed Today: Orders Placed This Encounter  Procedures   Cologuard   (Contact our referral department at [phone] if you have not spoken with someone about your referral appointment within the next 5 days)    Follow-up Plan Follow-up with Donella Stade, PA-C as  planned Please get [place] a copy of your mammogram results from 2022.  Cologuard referral has been sent.  Medicare wellness visit in one year.  AVS printed and mailed given to patient.      Health Maintenance, Female Adopting a healthy lifestyle and getting preventive care are important in promoting health and wellness. Ask your health care provider about: The right schedule for you to have regular tests and exams. Things you can do on your own to prevent diseases and keep yourself healthy. What should I know about diet, weight, and exercise? Eat a healthy diet  Eat a diet that includes plenty of vegetables, fruits, low-fat dairy products, and lean protein. Do not eat a lot of foods that are high in solid fats, added sugars, or sodium. Maintain a healthy weight Body mass index (BMI) is used to identify weight problems. It estimates body fat based on height and weight. Your health care provider can help determine your BMI and help you achieve or maintain a healthy weight. Get regular exercise Get regular exercise. This is one of the most important things you can do for your health. Most adults should: Exercise for at least 150 minutes each week. The exercise should increase your heart rate and make you sweat (moderate-intensity exercise). Do strengthening exercises at least twice a week. This is in addition to the moderate-intensity exercise. Spend less time sitting. Even light physical activity can be beneficial. Watch cholesterol and blood lipids Have your blood tested for lipids and cholesterol at 70 years of age, then have this test every 5 years. Have your cholesterol  levels checked more often if: Your lipid or cholesterol levels are high. You are older than 70 years of age. You are at high risk for heart disease. What should I know about cancer screening? Depending on your health history and family history, you may need to have cancer screening at various ages. This may include  screening for: Breast cancer. Cervical cancer. Colorectal cancer. Skin cancer. Lung cancer. What should I know about heart disease, diabetes, and high blood pressure? Blood pressure and heart disease High blood pressure causes heart disease and increases the risk of stroke. This is more likely to develop in people who have high blood pressure readings or are overweight. Have your blood pressure checked: Every 3-5 years if you are 51-12 years of age. Every year if you are 14 years old or older. Diabetes Have regular diabetes screenings. This checks your fasting blood sugar level. Have the screening done: Once every three years after age 41 if you are at a normal weight and have a low risk for diabetes. More often and at a younger age if you are overweight or have a high risk for diabetes. What should I know about preventing infection? Hepatitis B If you have a higher risk for hepatitis B, you should be screened for this virus. Talk with your health care provider to find out if you are at risk for hepatitis B infection. Hepatitis C Testing is recommended for: Everyone born from [birth year] through 1965. Anyone with known risk factors for hepatitis C. Sexually transmitted infections (STIs) Get screened for STIs, including gonorrhea and chlamydia, if: You are sexually active and are younger than 70 years of age. You are older than 70 years of age and your health care provider tells you that you are at risk for this type of infection. Your sexual activity has changed since you were last screened, and you are at increased risk for chlamydia or gonorrhea. Ask your health care provider if you are at risk. Ask your health care provider about whether you are at high risk for HIV. Your health care provider may recommend a prescription medicine to help prevent HIV infection. If you choose to take medicine to prevent HIV, you should first get tested for HIV. You should then be tested every 3 months for as  long as you are taking the medicine. Pregnancy If you are about to stop having your period (premenopausal) and you may become pregnant, seek counseling before you get pregnant. Take 400 to 800 micrograms (mcg) of folic acid every day if you become pregnant. Ask for birth control (contraception) if you want to prevent pregnancy. Osteoporosis and menopause Osteoporosis is a disease in which the bones lose minerals and strength with aging. This can result in bone fractures. If you are 41 years old or older, or if you are at risk for osteoporosis and fractures, ask your health care provider if you should: Be screened for bone loss. Take a calcium or vitamin D supplement to lower your risk of fractures. Be given hormone replacement therapy (HRT) to treat symptoms of menopause. Follow these instructions at home: Alcohol use Do not drink alcohol if: Your health care provider tells you not to drink. You are pregnant, may be pregnant, or are planning to become pregnant. If you drink alcohol: Limit how much you have to: 0-1 drink a day. Know how much alcohol is in your drink. In the [place] one drink equals one 12 oz bottle of beer (355 mL), one 5 oz glass of  wine (148 mL), or one 1 oz glass of hard liquor (44 mL). Lifestyle Do not use any products that contain nicotine or tobacco. These products include cigarettes, chewing tobacco, and vaping devices, such as e-cigarettes. If you need help quitting, ask your health care provider. Do not use street drugs. Do not share needles. Ask your health care provider for help if you need support or information about quitting drugs. General instructions Schedule regular health, dental, and eye exams. Stay current with your vaccines. Tell your health care provider if: You often feel depressed. You have ever been abused or do not feel safe at home. Summary Adopting a healthy lifestyle and getting preventive care are important in promoting health and  wellness. Follow your health care provider's instructions about healthy diet, exercising, and getting tested or screened for diseases. Follow your health care provider's instructions on monitoring your cholesterol and blood pressure. This information is not intended to replace advice given to you by your health care provider. Make sure you discuss any questions you have with your health care provider. Document Revised: 10/25/2020 Document Reviewed: 10/25/2020 Elsevier Patient Education  [place].

## 2021-07-27 DIAGNOSIS — Z1212 Encounter for screening for malignant neoplasm of rectum: Secondary | ICD-10-CM | POA: Diagnosis not present

## 2021-07-27 DIAGNOSIS — Z1211 Encounter for screening for malignant neoplasm of colon: Secondary | ICD-10-CM | POA: Diagnosis not present

## 2021-08-04 LAB — COLOGUARD: COLOGUARD: NEGATIVE

## 2021-08-05 NOTE — Progress Notes (Signed)
Normal cologuard. Follow up in 3 years.

## 2021-09-14 ENCOUNTER — Telehealth: Payer: Self-pay | Admitting: Neurology

## 2021-09-14 NOTE — Telephone Encounter (Signed)
Patient left vm stating she tested positive for Covid on Monday and wondering if she needs Paxlovid.  ?

## 2021-09-14 NOTE — Telephone Encounter (Signed)
Patient made aware no visits available in our clinic. She can have an evisit through [facility]. She will make evisit if needed.  ?

## 2021-11-21 DIAGNOSIS — Z01419 Encounter for gynecological examination (general) (routine) without abnormal findings: Secondary | ICD-10-CM | POA: Diagnosis not present

## 2021-11-21 DIAGNOSIS — Z1231 Encounter for screening mammogram for malignant neoplasm of breast: Secondary | ICD-10-CM | POA: Diagnosis not present

## 2021-12-26 DIAGNOSIS — L814 Other melanin hyperpigmentation: Secondary | ICD-10-CM | POA: Diagnosis not present

## 2021-12-26 DIAGNOSIS — D485 Neoplasm of uncertain behavior of skin: Secondary | ICD-10-CM | POA: Diagnosis not present

## 2021-12-26 DIAGNOSIS — C44319 Basal cell carcinoma of skin of other parts of face: Secondary | ICD-10-CM | POA: Diagnosis not present

## 2021-12-26 DIAGNOSIS — L821 Other seborrheic keratosis: Secondary | ICD-10-CM | POA: Diagnosis not present

## 2021-12-26 DIAGNOSIS — X32XXXS Exposure to sunlight, sequela: Secondary | ICD-10-CM | POA: Diagnosis not present

## 2021-12-26 DIAGNOSIS — L718 Other rosacea: Secondary | ICD-10-CM | POA: Diagnosis not present

## 2021-12-26 DIAGNOSIS — D235 Other benign neoplasm of skin of trunk: Secondary | ICD-10-CM | POA: Diagnosis not present

## 2021-12-26 DIAGNOSIS — L570 Actinic keratosis: Secondary | ICD-10-CM | POA: Diagnosis not present

## 2021-12-26 DIAGNOSIS — L820 Inflamed seborrheic keratosis: Secondary | ICD-10-CM | POA: Diagnosis not present

## 2022-01-26 DIAGNOSIS — C44319 Basal cell carcinoma of skin of other parts of face: Secondary | ICD-10-CM | POA: Diagnosis not present

## 2022-03-10 DIAGNOSIS — H04123 Dry eye syndrome of bilateral lacrimal glands: Secondary | ICD-10-CM | POA: Diagnosis not present

## 2022-03-10 DIAGNOSIS — H43813 Vitreous degeneration, bilateral: Secondary | ICD-10-CM | POA: Diagnosis not present

## 2022-03-10 DIAGNOSIS — H52203 Unspecified astigmatism, bilateral: Secondary | ICD-10-CM | POA: Diagnosis not present

## 2022-03-10 DIAGNOSIS — Z961 Presence of intraocular lens: Secondary | ICD-10-CM | POA: Diagnosis not present

## 2022-03-10 DIAGNOSIS — H524 Presbyopia: Secondary | ICD-10-CM | POA: Diagnosis not present

## 2022-05-12 ENCOUNTER — Other Ambulatory Visit: Payer: Self-pay | Admitting: Physician Assistant

## 2022-05-12 DIAGNOSIS — E039 Hypothyroidism, unspecified: Secondary | ICD-10-CM

## 2022-07-12 ENCOUNTER — Encounter: Payer: Self-pay | Admitting: Gastroenterology

## 2022-07-28 ENCOUNTER — Ambulatory Visit (INDEPENDENT_AMBULATORY_CARE_PROVIDER_SITE_OTHER): Payer: PPO | Admitting: Physician Assistant

## 2022-07-28 VITALS — BP 111/70 | HR 78 | Ht 63.0 in | Wt 137.0 lb

## 2022-07-28 DIAGNOSIS — Z0000 Encounter for general adult medical examination without abnormal findings: Secondary | ICD-10-CM | POA: Diagnosis not present

## 2022-07-28 NOTE — Patient Instructions (Signed)
[place] Maintenance Summary and Written Plan of Care  Tammy Humphrey ,  Thank you for allowing me to perform your Medicare Annual Wellness Visit and for your ongoing commitment to your health.   Health Maintenance & Immunization History Health Maintenance  Topic Date Due   COVID-19 Vaccine (4 - 2023-24 season) 08/13/2022 (Originally 02/17/2022)   Medicare Annual Wellness (AWV)  07/29/2023   MAMMOGRAM  11/22/2023   DEXA SCAN  03/09/2024   Fecal DNA (Cologuard)  07/27/2024   DTaP/Tdap/Td (2 - Td or Tdap) 03/04/2028   Pneumonia Vaccine 73+ Years old  Completed   INFLUENZA VACCINE  Completed   Hepatitis C Screening  Completed   Zoster Vaccines- Shingrix  Completed   HPV VACCINES  Aged Out   COLONOSCOPY (Pts 45-49yr Insurance coverage will need to be confirmed)  Discontinued   Immunization History  Administered Date(s) Administered   Influenza, High Dose Seasonal PF 04/11/2018, 04/16/2019, 04/19/2022   Influenza,inj,Quad PF,6+ Mos 04/11/2014   Influenza,inj,quad, With Preservative 04/01/2014   Influenza-Unspecified 05/03/2015, 04/06/2016, 04/19/2018, 04/21/2021   Moderna Sars-Covid-2 Vaccination 07/15/2019, 08/12/2019, 04/13/2020   Pneumococcal Conjugate-13 05/03/2015   Pneumococcal Polysaccharide-23 05/17/2016, 03/10/2020   Tdap 03/04/2018   Zoster Recombinat (Shingrix) 08/26/2020, 11/26/2020    These are the patient goals that we discussed:  Goals Addressed               This Visit's Progress     Patient Stated        Patient Stated (pt-stated)        Patient would like to continue to maintain her healthy lifestyle.         This is a list of Health Maintenance Items that are overdue or due now: There are no preventive care reminders to display for this patient.    Orders/Referrals Placed Today: No orders of the defined types were placed in this encounter.  (Contact our referral department at [phone] you have not spoken  with someone about your referral appointment within the next 5 days)    Follow-up Plan Follow-up with BDonella Stade PA-C as planned Medicare wellness visit in one year. AVS printed and given to the patient.      Health Maintenance, Female Adopting a healthy lifestyle and getting preventive care are important in promoting health and wellness. Ask your health care provider about: The right schedule for you to have regular tests and exams. Things you can do on your own to prevent diseases and keep yourself healthy. What should I know about diet, weight, and exercise? Eat a healthy diet  Eat a diet that includes plenty of vegetables, fruits, low-fat dairy products, and lean protein. Do not eat a lot of foods that are high in solid fats, added sugars, or sodium. Maintain a healthy weight Body mass index (BMI) is used to identify weight problems. It estimates body fat based on height and weight. Your health care provider can help determine your BMI and help you achieve or maintain a healthy weight. Get regular exercise Get regular exercise. This is one of the most important things you can do for your health. Most adults should: Exercise for at least 150 minutes each week. The exercise should increase your heart rate and make you sweat (moderate-intensity exercise). Do strengthening exercises at least twice a week. This is in addition to the moderate-intensity exercise. Spend less time sitting. Even light physical activity can be beneficial. Watch cholesterol and blood lipids Have your blood tested for lipids  and cholesterol at 71 years of age, then have this test every 5 years. Have your cholesterol levels checked more often if: Your lipid or cholesterol levels are high. You are older than 71 years of age. You are at high risk for heart disease. What should I know about cancer screening? Depending on your health history and family history, you may need to have cancer screening at  various ages. This may include screening for: Breast cancer. Cervical cancer. Colorectal cancer. Skin cancer. Lung cancer. What should I know about heart disease, diabetes, and high blood pressure? Blood pressure and heart disease High blood pressure causes heart disease and increases the risk of stroke. This is more likely to develop in people who have high blood pressure readings or are overweight. Have your blood pressure checked: Every 3-5 years if you are 36-18 years of age. Every year if you are 41 years old or older. Diabetes Have regular diabetes screenings. This checks your fasting blood sugar level. Have the screening done: Once every three years after age 89 if you are at a normal weight and have a low risk for diabetes. More often and at a younger age if you are overweight or have a high risk for diabetes. What should I know about preventing infection? Hepatitis B If you have a higher risk for hepatitis B, you should be screened for this virus. Talk with your health care provider to find out if you are at risk for hepatitis B infection. Hepatitis C Testing is recommended for: Everyone born from [birth year] through 1965. Anyone with known risk factors for hepatitis C. Sexually transmitted infections (STIs) Get screened for STIs, including gonorrhea and chlamydia, if: You are sexually active and are younger than 71 years of age. You are older than 71 years of age and your health care provider tells you that you are at risk for this type of infection. Your sexual activity has changed since you were last screened, and you are at increased risk for chlamydia or gonorrhea. Ask your health care provider if you are at risk. Ask your health care provider about whether you are at high risk for HIV. Your health care provider may recommend a prescription medicine to help prevent HIV infection. If you choose to take medicine to prevent HIV, you should first get tested for HIV. You should then be  tested every 3 months for as long as you are taking the medicine. Pregnancy If you are about to stop having your period (premenopausal) and you may become pregnant, seek counseling before you get pregnant. Take 400 to 800 micrograms (mcg) of folic acid every day if you become pregnant. Ask for birth control (contraception) if you want to prevent pregnancy. Osteoporosis and menopause Osteoporosis is a disease in which the bones lose minerals and strength with aging. This can result in bone fractures. If you are 71 years old or older, or if you are at risk for osteoporosis and fractures, ask your health care provider if you should: Be screened for bone loss. Take a calcium or vitamin D supplement to lower your risk of fractures. Be given hormone replacement therapy (HRT) to treat symptoms of menopause. Follow these instructions at home: Alcohol use Do not drink alcohol if: Your health care provider tells you not to drink. You are pregnant, may be pregnant, or are planning to become pregnant. If you drink alcohol: Limit how much you have to: 0-1 drink a day. Know how much alcohol is in your drink. In the  [place] one drink equals one 12 oz bottle of beer (355 mL), one 5 oz glass of wine (148 mL), or one 1 oz glass of hard liquor (44 mL). Lifestyle Do not use any products that contain nicotine or tobacco. These products include cigarettes, chewing tobacco, and vaping devices, such as e-cigarettes. If you need help quitting, ask your health care provider. Do not use street drugs. Do not share needles. Ask your health care provider for help if you need support or information about quitting drugs. General instructions Schedule regular health, dental, and eye exams. Stay current with your vaccines. Tell your health care provider if: You often feel depressed. You have ever been abused or do not feel safe at home. Summary Adopting a healthy lifestyle and getting preventive care are important in  promoting health and wellness. Follow your health care provider's instructions about healthy diet, exercising, and getting tested or screened for diseases. Follow your health care provider's instructions on monitoring your cholesterol and blood pressure. This information is not intended to replace advice given to you by your health care provider. Make sure you discuss any questions you have with your health care provider. Document Revised: 10/25/2020 Document Reviewed: 10/25/2020 Elsevier Patient Education  [place].

## 2022-07-28 NOTE — Progress Notes (Signed)
MEDICARE ANNUAL WELLNESS VISIT  07/28/2022  Subjective:  Tammy Humphrey is a 72 y.o. female patient of Donella Stade, PA-C who had a [employer] Visit today. Tammy Humphrey is Retired and lives with their son. she has 2 children. she reports that she is socially active and does interact with friends/family regularly. she is moderately physically active and enjoys walking and being outside.  Patient Care Team: Lavada Mesi as PCP - General (Family Medicine)     07/28/2022    2:07 PM 07/22/2021    3:02 PM 01/29/2020    1:11 PM 01/14/2019    8:12 AM 08/03/2014    1:25 PM  Advanced Directives  Does Patient Have a Medical Advance Directive? No No No No Yes;No  Does patient want to make changes to medical advance directive?     No - Patient declined  Would patient like information on creating a medical advance directive? No - Patient declined No - Patient declined Yes (MAU/Ambulatory/Procedural Areas - Information given) No - Patient declined     Hospital Utilization Over the Past 12 Months: # of hospitalizations or ER visits: 0 # of surgeries: 0  Review of Systems    Patient reports that her overall health is unchanged when compared to last year.  Review of Systems: History obtained from chart review and the patient  All other systems negative.  Pain Assessment Pain : No/denies pain     Current Medications & Allergies (verified) Allergies as of 07/28/2022       Reactions   Prolia [denosumab]    Bone pain        Medication List        Accurate as of July 28, 2022  2:20 PM. If you have any questions, ask your nurse or doctor.          Calcium 600-200 MG-UNIT tablet Take 1 tablet by mouth 2 (two) times daily.   levothyroxine 75 MCG tablet Commonly known as: SYNTHROID Take 1 tablet (75 mcg total) by mouth daily before breakfast. Needs appt/labs   TURMERIC PO Take by mouth.        History (reviewed): Past Medical History:  Diagnosis Date    Osteopenia    Thyroid disease    History reviewed. No pertinent surgical history. 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 She enjoys walking and being outside.   Social Determinants of Health   Financial Resource Strain: Low Risk  (07/28/2022)   Overall Financial Resource Strain (CARDIA)    Difficulty of Paying Living Expenses: Not hard at all  Food Insecurity: No Food Insecurity (07/28/2022)  Hunger Vital Sign    Worried About Running Out of Food in the Last Year: Never true    Ran Out of Food in the Last Year: Never true  Transportation Needs: No Transportation Needs (07/28/2022)   PRAPARE - Hydrologist (Medical): No    Lack of Transportation (Non-Medical): No  Physical Activity: Sufficiently Active (07/28/2022)   Exercise Vital Sign    Days of Exercise per Week: 7 days    Minutes of Exercise per Session: 50 min  Stress: No Stress Concern Present (07/28/2022)   [place]    Feeling of Stress : Not at all  Social Connections: Socially Isolated (07/28/2022)   Social Connection and Isolation Panel [NHANES]    Frequency of Communication with Friends and Family: Three times  a week    Frequency of Social Gatherings with Friends and Family: Twice a week    Attends Religious Services: Never    Marine scientist or Organizations: No    Attends Archivist Meetings: Never    Marital Status: Widowed    Activities of Daily Living    07/28/2022    2:11 PM  In your present state of health, do you have any difficulty performing the following activities:  Hearing? 0  Vision? 0  Difficulty concentrating or making decisions? 0  Walking or climbing stairs? 0  Dressing or bathing? 0  Doing errands, shopping? 0  Preparing Food and eating ? N  Using the Toilet? N  In the past six months, have you accidently leaked urine? N  Do you have problems with loss of bowel control? N  Managing your Medications? N  Managing your Finances? N  Housekeeping or managing your Housekeeping? N    Patient Education/Literacy How often do you need to have someone help you when you read instructions, pamphlets, or other written materials from your doctor or pharmacy?: 1 - Never What is the last grade level you completed in school?: 12th grade  Exercise Current Exercise Habits: Home exercise routine, Type of exercise: walking, Time (Minutes): 50, Frequency (Times/Week): 7, Weekly Exercise (Minutes/Week): 350, Intensity: Moderate, Exercise limited by: None identified  Diet Patient reports consuming 2 meals a day and 1 snack(s) a day Patient reports that her primary diet is: Regular Patient reports that she does have regular access to food.   Depression Screen    07/28/2022    2:08 PM 07/22/2021    3:03 PM 05/20/2021    5:49 AM 01/29/2020    1:12 PM 01/14/2019    8:11 AM 03/04/2018    9:57 AM  PHQ 2/9 Scores  PHQ - 2 Score 0 0 0 0 0 0  PHQ- 9 Score      0     Fall Risk    07/28/2022    2:08 PM 07/22/2021    3:03 PM 01/29/2020    1:12 PM 01/13/2020    3:43 PM 05/13/2019    4:09 PM  [place] in the past year? 0 0 0 0 0  Comment    [employer] Telephone Survey: data  to providers prior to load [employer] Survey: data to providers prior to load  Number falls in past yr: 0 0 0    Injury with Fall? 0 0     Risk for fall due to : No Fall Risks No Fall Risks     Follow up Falls evaluation completed Education  provided;Falls evaluation completed Falls evaluation completed       Objective:   BP 111/70 (BP Location: Right Arm, Patient Position: Sitting, Cuff Size: Normal)   Pulse 78   Ht 5' 3"$  (1.6 m)   Wt 137 lb (62.1 kg)   SpO2 96%   BMI 24.27 kg/m   Last Weight  Most recent update: 07/28/2022  2:05 PM    Weight  62.1 kg (137 lb)             Body mass index is 24.27 kg/m.  Hearing/Vision  Dawnne did not have difficulty with hearing/understanding during the face-to-face interview Meya did not have difficulty with her vision during the face-to-face interview Reports that she has had a formal eye exam by an eye care professional within the past year Reports that she has not had a formal hearing evaluation within the past year  Cognitive Function:    07/28/2022    2:13 PM 07/22/2021    3:08 PM 01/29/2020    1:15 PM 01/14/2019    8:17 AM  6CIT Screen  What Year? 0 points 0 points 0 points 0 points  What month? 0 points 0 points 0 points 0 points  What time? 0 points 0 points 0 points 0 points  Count back from 20 0 points 0 points 0 points 0 points  Months in reverse 0 points 0 points 0 points 0 points  Repeat phrase 0 points 0 points 0 points 0 points  Total Score 0 points 0 points 0 points 0 points    Normal Cognitive Function Screening: Yes (Normal:0-7, Significant for Dysfunction: >8)  Immunization & Health Maintenance Record Immunization History  Administered Date(s) Administered   Influenza, High Dose Seasonal PF 04/11/2018, 04/16/2019, 04/19/2022   Influenza,inj,Quad PF,6+ Mos 04/11/2014   Influenza,inj,quad, With Preservative 04/01/2014   Influenza-Unspecified 05/03/2015, 04/06/2016, 04/19/2018, 04/21/2021   Moderna Sars-Covid-2  Vaccination 07/15/2019, 08/12/2019, 04/13/2020   Pneumococcal Conjugate-13 05/03/2015   Pneumococcal Polysaccharide-23 05/17/2016, 03/10/2020   Tdap 03/04/2018   Zoster Recombinat (Shingrix) 08/26/2020, 11/26/2020    Health Maintenance  Topic Date Due   COVID-19 Vaccine (4 - 2023-24 season) 08/13/2022 (Originally 02/17/2022)   Medicare Annual Wellness (AWV)  07/29/2023   MAMMOGRAM  11/22/2023   DEXA SCAN  03/09/2024   Fecal DNA (Cologuard)  07/27/2024   DTaP/Tdap/Td (2 - Td or Tdap) 03/04/2028   Pneumonia Vaccine 59+ Years old  Completed   INFLUENZA VACCINE  Completed   Hepatitis C Screening  Completed   Zoster Vaccines- Shingrix  Completed   HPV VACCINES  Aged Out   COLONOSCOPY (Pts 45-52yr Insurance coverage will need to be confirmed)  Discontinued       Assessment  This is a routine wellness examination for [employer]  Health Maintenance: Due or Overdue There are no preventive care reminders to display for this patient.   BVerita Lambdoes not need a referral for CCommercial Metals CompanyAssistance: Care Management:   no Social Work:    no Prescription Assistance:  no Nutrition/Diabetes Education:  no   Plan:  Personalized Goals  Goals Addressed               This Visit's Progress     Patient Stated        Patient Stated (pt-stated)        Patient would like to continue to maintain her healthy lifestyle.       Personalized Health Maintenance & Screening Recommendations  There are no preventive care reminders to display for  this patient.  Lung Cancer Screening Recommended: no (Low Dose CT Chest recommended if Age 44-80 years, 30 pack-year currently smoking OR have quit w/in past 15 years) Hepatitis C Screening recommended: no HIV Screening recommended: no  Advanced Directives: Written information was not given per the patient's request.  Referrals & Orders No orders of the defined types were placed in this encounter.   Follow-up Plan Follow-up with  Donella Stade, PA-C as planned Medicare wellness visit in one year. AVS printed and given to the patient.   I have personally reviewed and noted the following in the patient's chart:   Medical and social history Use of alcohol, tobacco or illicit drugs  Current medications and supplements Functional ability and status Nutritional status Physical activity Advanced directives List of other physicians Hospitalizations, surgeries, and ER visits in previous 12 months Vitals Screenings to include cognitive, depression, and falls Referrals and appointments  In addition, I have reviewed and discussed with patient certain preventive protocols, quality metrics, and best practice recommendations. A written personalized care plan for preventive services as well as general preventive health recommendations were provided to patient.     Tinnie Gens, RN BSN  07/28/2022

## 2022-07-31 DIAGNOSIS — L821 Other seborrheic keratosis: Secondary | ICD-10-CM | POA: Diagnosis not present

## 2022-07-31 DIAGNOSIS — L820 Inflamed seborrheic keratosis: Secondary | ICD-10-CM | POA: Diagnosis not present

## 2022-07-31 DIAGNOSIS — Z85828 Personal history of other malignant neoplasm of skin: Secondary | ICD-10-CM | POA: Diagnosis not present

## 2022-08-12 ENCOUNTER — Other Ambulatory Visit: Payer: Self-pay | Admitting: Physician Assistant

## 2022-08-12 DIAGNOSIS — E039 Hypothyroidism, unspecified: Secondary | ICD-10-CM

## 2022-08-15 ENCOUNTER — Ambulatory Visit: Payer: PPO | Admitting: Gastroenterology

## 2022-08-17 ENCOUNTER — Encounter: Payer: Self-pay | Admitting: Gastroenterology

## 2022-08-17 ENCOUNTER — Ambulatory Visit: Payer: PPO | Admitting: Gastroenterology

## 2022-08-17 VITALS — BP 90/68 | HR 88 | Ht 62.5 in | Wt 135.2 lb

## 2022-08-17 DIAGNOSIS — R112 Nausea with vomiting, unspecified: Secondary | ICD-10-CM

## 2022-08-17 DIAGNOSIS — K219 Gastro-esophageal reflux disease without esophagitis: Secondary | ICD-10-CM

## 2022-08-17 MED ORDER — FAMOTIDINE 40 MG PO TABS: 40.0000 mg | ORAL_TABLET | Freq: Every day | ORAL | 6 refills | Status: DC

## 2022-08-17 NOTE — Progress Notes (Signed)
Chief Complaint: For reflux  Referring Provider:  Donella Stade, PA-C      ASSESSMENT AND PLAN;   #1. Long standing GERD.  Wants to avoid PPIs  #2. Occ N/V  Plan: -Pepcid '40mg'$  po QD #30, 6RF (before lunch) -EGD. -Brochures GERD.  Proceed with EGD. I have discussed the risks and benefits. The risks including rare risk of perforation, bleeding, missed UGI neoplasms, risks of anesthesia/sedation. Alternatives were given. Patient is aware and agrees to proceed. All the questions were answered. This will be scheduled in upcoming days. Consent forms were given for review.  He HPI:    Tammy Humphrey is a 72 y.o. female  With HLD, hypothyroidism, osteopenia   C/O GERD x 15 yrs, worse over last yr with  Heartburn, regurgitation, Occ N/V With cough Mostly late afternoon, after lunch Given prescription PPIs-she wants to avoid taking medications due to concerns regarding osteoporosis. Denies having any odynophagia or dysphagia. Had remote history of EGD '@[place]'$  with dilation over 10 yrs ago.  I could not find any report in Care Everywhere.  No abdominal pain.  She denies having any melena or weight loss  No sodas, chocolates, chewing gums, artificial sweeteners and candy. No NSAIDs  No diarrhea/constipation.  Has osteopenia-took prolia last year-stopped due to side effects.   Past GI workup:  -EGD with dil '@[place]'$  many years ago.  Was told that she also has hiatal hernia.  Given PPIs.  -Colon (Dr Sherral Hammers)- neg at  age 65.  Opted for Cologuard.  Last Cologuard 07/2021 neg. repeat in 3 years. Past Medical History:  Diagnosis Date   GERD (gastroesophageal reflux disease)    HLD (hyperlipidemia)    Hypothyroidism    Osteopenia    Status post dilation of esophageal narrowing     Past Surgical History:  Procedure Laterality Date   CESAREAN SECTION     x 2    Family History  Problem Relation Age of Onset   Hypertension Mother    Heart attack Father     Stroke Father    Diverticulitis Sister    Stomach cancer Maternal Grandfather    Heart attack Paternal Grandmother     Social History   Tobacco Use   Smoking status: Never   Smokeless tobacco: Never  Vaping Use   Vaping Use: Never used  Substance Use Topics   Alcohol use: No    Alcohol/week: 0.0 standard drinks of alcohol   Drug use: No    Current Outpatient Medications  Medication Sig Dispense Refill   Calcium 600-200 MG-UNIT tablet Take 1 tablet by mouth 2 (two) times daily.     levothyroxine (SYNTHROID) 75 MCG tablet Take 1 tablet (75 mcg total) by mouth daily before breakfast. PATIENT NEEDS LABS IN ORDER TO RECEIVE 90 DAY SUPPLY 30 tablet 0   TURMERIC PO Take by mouth.     No current facility-administered medications for this visit.    Allergies  Allergen Reactions   Prolia [Denosumab]     Bone pain    Review of Systems:  Constitutional: Denies fever, chills, diaphoresis, appetite change and fatigue.  HEENT: neg Respiratory: Denies SOB, DOE, has occ cough, no chest tightness,  and wheezing.   Cardiovascular: Denies chest pain, palpitations and leg swelling.  Genitourinary: Denies dysuria, urgency, frequency, hematuria, flank pain and difficulty urinating.  Musculoskeletal: Denies myalgias, back pain, joint swelling, arthralgias and gait problem.  Skin: No rash.  Neurological: Denies dizziness, seizures, syncope, weakness, light-headedness, numbness and headaches.  Hematological: Denies adenopathy. Easy bruising, personal or family bleeding history  Psychiatric/Behavioral: No anxiety or depression     Physical Exam:    BP 90/68 (BP Location: Left Arm, Patient Position: Sitting, Cuff Size: Normal)   Pulse 88   Ht 5' 2.5" (1.588 m)   Wt 135 lb 4 oz (61.3 kg)   BMI 24.34 kg/m  Wt Readings from Last 3 Encounters:  08/17/22 135 lb 4 oz (61.3 kg)  07/28/22 137 lb (62.1 kg)  07/22/21 137 lb 0.6 oz (62.2 kg)   Constitutional:  Well-developed, in no acute  distress. Psychiatric: Normal mood and affect. Behavior is normal. HEENT: Pupils normal.  Conjunctivae are normal. No scleral icterus.  Cardiovascular: Normal rate, regular rhythm. No edema Pulmonary/chest: Effort normal and breath sounds normal. No wheezing, rales or rhonchi. Abdominal: Soft, nondistended. Nontender. Bowel sounds active throughout. There are no masses palpable. No hepatomegaly. Rectal: Deferred Neurological: Alert and oriented to person place and time. Skin: Skin is warm and dry. No rashes noted.  Data Reviewed: I have personally reviewed following labs and imaging studies  CBC:    Latest Ref Rng & Units 05/10/2021    8:11 AM 03/16/2020   12:12 PM  CBC  WBC 3.8 - 10.8 Thousand/uL 5.9  8.0   Hemoglobin 11.7 - 15.5 g/dL 14.3  12.9   Hematocrit 35.0 - 45.0 % 42.9  38.5   Platelets 140 - 400 Thousand/uL 364  389     CMP:    Latest Ref Rng & Units 05/10/2021    8:11 AM 03/16/2020   12:12 PM 11/14/2019   10:17 AM  CMP  Glucose 65 - 99 mg/dL 95  102  97   BUN 7 - 25 mg/dL '13  14  19   '$ Creatinine 0.50 - 1.05 mg/dL 0.95  0.83  1.03   Sodium 135 - 146 mmol/L 141  141  141   Potassium 3.5 - 5.3 mmol/L 5.1  4.3  4.5   Chloride 98 - 110 mmol/L 106  106  107   CO2 20 - 32 mmol/L '29  27  26   '$ Calcium 8.6 - 10.4 mg/dL 9.4  8.6  9.0   Total Protein 6.1 - 8.1 g/dL 6.2  6.3  6.2   Total Bilirubin 0.2 - 1.2 mg/dL 1.1  0.6  1.0   AST 10 - 35 U/L '16  16  14   '$ ALT 6 - 29 U/L '13  18  8       '$ Radiology Studies: No results found.    Carmell Austria, MD 08/17/2022, 8:57 AM  Cc: Donella Stade, PA-C

## 2022-08-17 NOTE — Patient Instructions (Addendum)
_______________________________________________________  If your blood pressure at your visit was 140/90 or greater, please contact your primary care physician to follow up on this.  _______________________________________________________  If you are age 71 or older, your body mass index should be between 23-30. Your Body mass index is 24.34 kg/m. If this is out of the aforementioned range listed, please consider follow up with your Primary Care Provider.  If you are age 35 or younger, your body mass index should be between 19-25. Your Body mass index is 24.34 kg/m. If this is out of the aformentioned range listed, please consider follow up with your Primary Care Provider.   ________________________________________________________  The [place] [facility] providers would like to encourage you to use [facility] to communicate with providers for non-urgent requests or questions.  Due to long hold times on the telephone, sending your provider a message by [facility] may be a faster and more efficient way to get a response.  Please allow 48 business hours for a response.  Please remember that this is for non-urgent requests.  _______________________________________________________  We have sent the following medications to your pharmacy for you to pick up at your convenience:  Pepcid  You have been scheduled for an endoscopy. Please follow written instructions given to you at your visit today. If you use inhalers (even only as needed), please bring them with you on the day of your procedure.  Gastroesophageal Reflux Disease, Adult  Gastroesophageal reflux (GER) happens when acid from the stomach flows up into the tube that connects the mouth and the stomach (esophagus). Normally, food travels down the esophagus and stays in the stomach to be digested. With GER, food and stomach acid sometimes move back up into the esophagus. You may have a disease called gastroesophageal reflux disease (GERD) if the  reflux: Happens often. Causes frequent or very bad symptoms. Causes problems such as damage to the esophagus. When this happens, the esophagus becomes sore and swollen. Over time, GERD can make small holes (ulcers) in the lining of the esophagus. What are the causes? This condition is caused by a problem with the muscle between the esophagus and the stomach. When this muscle is weak or not normal, it does not close properly to keep food and acid from coming back up from the stomach. The muscle can be weak because of: Tobacco use. Pregnancy. Having a certain type of hernia (hiatal hernia). Alcohol use. Certain foods and drinks, such as coffee, chocolate, onions, and peppermint. What increases the risk? Being overweight. Having a disease that affects your connective tissue. Taking NSAIDs, such a ibuprofen. What are the signs or symptoms? Heartburn. Difficult or painful swallowing. The feeling of having a lump in the throat. A bitter taste in the mouth. Bad breath. Having a lot of saliva. Having an upset or bloated stomach. Burping. Chest pain. Different conditions can cause chest pain. Make sure you see your doctor if you have chest pain. Shortness of breath or wheezing. A long-term cough or a cough at night. Wearing away of the surface of teeth (tooth enamel). Weight loss. How is this treated? Making changes to your diet. Taking medicine. Having surgery. Treatment will depend on how bad your symptoms are. Follow these instructions at home: Eating and drinking  Follow a diet as told by your doctor. You may need to avoid foods and drinks such as: Coffee and tea, with or without caffeine. Drinks that contain alcohol. Energy drinks and sports drinks. Bubbly (carbonated) drinks or sodas. Chocolate and cocoa. Peppermint and  mint flavorings. Garlic and onions. Horseradish. Spicy and acidic foods. These include peppers, chili powder, curry powder, vinegar, hot sauces, and BBQ  sauce. Citrus fruit juices and citrus fruits, such as oranges, lemons, and limes. Tomato-based foods. These include red sauce, chili, salsa, and pizza with red sauce. Fried and fatty foods. These include donuts, french fries, potato chips, and high-fat dressings. High-fat meats. These include hot dogs, rib eye steak, sausage, ham, and bacon. High-fat dairy items, such as whole milk, butter, and cream cheese. Eat small meals often. Avoid eating large meals. Avoid drinking large amounts of liquid with your meals. Avoid eating meals during the 2-3 hours before bedtime. Avoid lying down right after you eat. Do not exercise right after you eat. Lifestyle  Do not smoke or use any products that contain nicotine or tobacco. If you need help quitting, ask your doctor. Try to lower your stress. If you need help doing this, ask your doctor. If you are overweight, lose an amount of weight that is healthy for you. Ask your doctor about a safe weight loss goal. General instructions Pay attention to any changes in your symptoms. Take over-the-counter and prescription medicines only as told by your doctor. Do not take aspirin, ibuprofen, or other NSAIDs unless your doctor says it is okay. Wear loose clothes. Do not wear anything tight around your waist. Raise (elevate) the head of your bed about 6 inches (15 cm). You may need to use a wedge to do this. Avoid bending over if this makes your symptoms worse. Keep all follow-up visits. Contact a doctor if: You have new symptoms. You lose weight and you do not know why. You have trouble swallowing or it hurts to swallow. You have wheezing or a cough that keeps happening. You have a hoarse voice. Your symptoms do not get better with treatment. Get help right away if: You have sudden pain in your arms, neck, jaw, teeth, or back. You suddenly feel sweaty, dizzy, or light-headed. You have chest pain or shortness of breath. You vomit and the vomit is green,  yellow, or black, or it looks like blood or coffee grounds. You faint. Your poop (stool) is red, bloody, or black. You cannot swallow, drink, or eat. These symptoms may represent a serious problem that is an emergency. Do not wait to see if the symptoms will go away. Get medical help right away. Call your local emergency services (911 in the [place] Do not drive yourself to the hospital. Summary If a person has gastroesophageal reflux disease (GERD), food and stomach acid move back up into the esophagus and cause symptoms or problems such as damage to the esophagus. Treatment will depend on how bad your symptoms are. Follow a diet as told by your doctor. Take all medicines only as told by your doctor. This information is not intended to replace advice given to you by your health care provider. Make sure you discuss any questions you have with your health care provider. Document Revised: 12/15/2019 Document Reviewed: 12/15/2019 Elsevier Patient Education  [place] you,  Dr. Jackquline Denmark

## 2022-09-07 ENCOUNTER — Encounter: Payer: Self-pay | Admitting: Physician Assistant

## 2022-09-07 DIAGNOSIS — Z131 Encounter for screening for diabetes mellitus: Secondary | ICD-10-CM

## 2022-09-07 DIAGNOSIS — D508 Other iron deficiency anemias: Secondary | ICD-10-CM

## 2022-09-07 DIAGNOSIS — E039 Hypothyroidism, unspecified: Secondary | ICD-10-CM

## 2022-09-07 DIAGNOSIS — E785 Hyperlipidemia, unspecified: Secondary | ICD-10-CM

## 2022-09-08 DIAGNOSIS — D508 Other iron deficiency anemias: Secondary | ICD-10-CM | POA: Diagnosis not present

## 2022-09-08 DIAGNOSIS — E039 Hypothyroidism, unspecified: Secondary | ICD-10-CM | POA: Diagnosis not present

## 2022-09-08 DIAGNOSIS — Z131 Encounter for screening for diabetes mellitus: Secondary | ICD-10-CM | POA: Diagnosis not present

## 2022-09-08 DIAGNOSIS — E785 Hyperlipidemia, unspecified: Secondary | ICD-10-CM | POA: Diagnosis not present

## 2022-09-09 LAB — CBC WITH DIFFERENTIAL/PLATELET
Absolute Monocytes: 424 cells/uL (ref 200–950)
Basophils Absolute: 50 cells/uL (ref 0–200)
Basophils Relative: 1.2 %
Eosinophils Absolute: 130 cells/uL (ref 15–500)
Eosinophils Relative: 3.1 %
HCT: 44.8 % (ref 35.0–45.0)
Hemoglobin: 14.8 g/dL (ref 11.7–15.5)
Lymphs Abs: 1785 cells/uL (ref 850–3900)
MCH: 31.4 pg (ref 27.0–33.0)
MCHC: 33 g/dL (ref 32.0–36.0)
MCV: 94.9 fL (ref 80.0–100.0)
MPV: 10.3 fL (ref 7.5–12.5)
Monocytes Relative: 10.1 %
Neutro Abs: 1810 cells/uL (ref 1500–7800)
Neutrophils Relative %: 43.1 %
Platelets: 337 10*3/uL (ref 140–400)
RBC: 4.72 10*6/uL (ref 3.80–5.10)
RDW: 11.8 % (ref 11.0–15.0)
Total Lymphocyte: 42.5 %
WBC: 4.2 10*3/uL (ref 3.8–10.8)

## 2022-09-09 LAB — COMPLETE METABOLIC PANEL WITH GFR
AG Ratio: 1.6 (calc) (ref 1.0–2.5)
ALT: 11 U/L (ref 6–29)
AST: 18 U/L (ref 10–35)
Albumin: 4.2 g/dL (ref 3.6–5.1)
Alkaline phosphatase (APISO): 56 U/L (ref 37–153)
BUN: 22 mg/dL (ref 7–25)
CO2: 25 mmol/L (ref 20–32)
Calcium: 9.6 mg/dL (ref 8.6–10.4)
Chloride: 106 mmol/L (ref 98–110)
Creat: 0.97 mg/dL (ref 0.60–1.00)
Globulin: 2.6 g/dL (calc) (ref 1.9–3.7)
Glucose, Bld: 86 mg/dL (ref 65–99)
Potassium: 5 mmol/L (ref 3.5–5.3)
Sodium: 141 mmol/L (ref 135–146)
Total Bilirubin: 1 mg/dL (ref 0.2–1.2)
Total Protein: 6.8 g/dL (ref 6.1–8.1)
eGFR: 63 mL/min/{1.73_m2} (ref >=60)

## 2022-09-09 LAB — LIPID PANEL W/REFLEX DIRECT LDL
Cholesterol: 266 mg/dL — ABNORMAL HIGH (ref <200)
HDL: 95 mg/dL (ref >=50)
LDL Cholesterol (Calc): 154 mg/dL (calc) — ABNORMAL HIGH
Non-HDL Cholesterol (Calc): 171 mg/dL (calc) — ABNORMAL HIGH (ref <130)
Total CHOL/HDL Ratio: 2.8 (calc) (ref <5.0)
Triglycerides: 72 mg/dL (ref <150)

## 2022-09-09 LAB — TSH: TSH: 2.93 mIU/L (ref 0.40–4.50)

## 2022-09-11 ENCOUNTER — Encounter: Payer: Self-pay | Admitting: Physician Assistant

## 2022-09-11 ENCOUNTER — Other Ambulatory Visit: Payer: Self-pay | Admitting: Physician Assistant

## 2022-09-11 DIAGNOSIS — E039 Hypothyroidism, unspecified: Secondary | ICD-10-CM

## 2022-09-11 MED ORDER — LEVOTHYROXINE SODIUM 75 MCG PO TABS: 75.0000 ug | ORAL_TABLET | Freq: Every day | ORAL | 3 refills | Status: DC

## 2022-09-11 NOTE — Progress Notes (Signed)
Tinya,   Normal kidney, liver, glucose.  Thyroid looks great. Refills sent for 1 year.  LDL not to goal.  HDL looks great.   10 year risk still below 7.5 percent. Goal LdL is under 130. Optional to start low dose statin to lower CV risk. Thoughts?   Marland KitchenMarland KitchenThe 10-year ASCVD risk score (Arnett DK, et al., 2019) is: 4.8%   Values used to calculate the score:     Age: 72 years     Sex: Female     Is Non-Hispanic African American: No     Diabetic: No     Tobacco smoker: No     Systolic Blood Pressure: 90 mmHg     Is BP treated: No     HDL Cholesterol: 95 mg/dL     Total Cholesterol: 266 mg/dL

## 2022-09-12 ENCOUNTER — Encounter: Payer: Self-pay | Admitting: Gastroenterology

## 2022-09-12 ENCOUNTER — Ambulatory Visit (AMBULATORY_SURGERY_CENTER): Payer: PPO | Admitting: Gastroenterology

## 2022-09-12 VITALS — BP 135/82 | HR 65 | Temp 98.0°F | Resp 15 | Ht 62.0 in | Wt 135.0 lb

## 2022-09-12 DIAGNOSIS — K2100 Gastro-esophageal reflux disease with esophagitis, without bleeding: Secondary | ICD-10-CM | POA: Diagnosis not present

## 2022-09-12 DIAGNOSIS — E039 Hypothyroidism, unspecified: Secondary | ICD-10-CM | POA: Diagnosis not present

## 2022-09-12 DIAGNOSIS — K2101 Gastro-esophageal reflux disease with esophagitis, with bleeding: Secondary | ICD-10-CM | POA: Diagnosis not present

## 2022-09-12 DIAGNOSIS — K2951 Unspecified chronic gastritis with bleeding: Secondary | ICD-10-CM | POA: Diagnosis not present

## 2022-09-12 DIAGNOSIS — R112 Nausea with vomiting, unspecified: Secondary | ICD-10-CM

## 2022-09-12 DIAGNOSIS — K219 Gastro-esophageal reflux disease without esophagitis: Secondary | ICD-10-CM

## 2022-09-12 DIAGNOSIS — K2970 Gastritis, unspecified, without bleeding: Secondary | ICD-10-CM

## 2022-09-12 DIAGNOSIS — E785 Hyperlipidemia, unspecified: Secondary | ICD-10-CM | POA: Diagnosis not present

## 2022-09-12 MED ORDER — SODIUM CHLORIDE 0.9 % IV SOLN: 500.0000 mL | Freq: Once | INTRAVENOUS | Status: DC

## 2022-09-12 NOTE — Patient Instructions (Signed)
Await pathology results of biopsies done ( gastric & esophageal)  Handouts on gastritis given to you today  Pepcid 40 mg by mouth once a day-PATIENT SAYS SHE ALREADY HAS 90 DAY Rx  NO ASPIRIN, ASPIRIN CONTAINING PRODUCTS (BC OR GOODY POWDERS) OR NSAIDS (IBUPROFEN, ADVIL, ALEVE, AND MOTRIN) ; TYLENOL IS OK TO TAKE  FOLLOW AN ANTI REFLUX REGIMEN- ORANGE HANDOUT GIVEN TO YOU    YOU HAD AN ENDOSCOPIC PROCEDURE TODAY AT [facility]:   Refer to the procedure report that was given to you for any specific questions about what was found during the examination.  If the procedure report does not answer your questions, please call your gastroenterologist to clarify.  If you requested that your care partner not be given the details of your procedure findings, then the procedure report has been included in a sealed envelope for you to review at your convenience later.  YOU SHOULD EXPECT: Some feelings of bloating in the abdomen. Passage of more gas than usual.  Walking can help get rid of the air that was put into your GI tract during the procedure and reduce the bloating. If you had a lower endoscopy (such as a colonoscopy or flexible sigmoidoscopy) you may notice spotting of blood in your stool or on the toilet paper. If you underwent a bowel prep for your procedure, you may not have a normal bowel movement for a few days.  Please Note:  You might notice some irritation and congestion in your nose or some drainage.  This is from the oxygen used during your procedure.  There is no need for concern and it should clear up in a day or so.  SYMPTOMS TO REPORT IMMEDIATELY:   Following upper endoscopy (EGD)  Vomiting of blood or coffee ground material  New chest pain or pain under the shoulder blades  Painful or persistently difficult swallowing  New shortness of breath  Fever of 100F or higher  Black, tarry-looking stools  For urgent or emergent issues, a gastroenterologist can be  reached at any hour by calling [phone]. Do not use MyChart messaging for urgent concerns.    DIET:  We do recommend a small meal at first, but then you may proceed to your regular diet.  Drink plenty of fluids but you should avoid alcoholic beverages for 24 hours.  ACTIVITY:  You should plan to take it easy for the rest of today and you should NOT DRIVE or use heavy machinery until tomorrow (because of the sedation medicines used during the test).    FOLLOW UP: Our staff will call the number listed on your records the next business day following your procedure.  We will call around 7:15- 8:00 am to check on you and address any questions or concerns that you may have regarding the information given to you following your procedure. If we do not reach you, we will leave a message.     If any biopsies were taken you will be contacted by phone or by letter within the next 1-3 weeks.  Please call us at [phone] if you have not heard about the biopsies in 3 weeks.    SIGNATURES/CONFIDENTIALITY: You and/or your care partner have signed paperwork which will be entered into your electronic medical record.  These signatures attest to the fact that that the information above on your After Visit Summary has been reviewed and is understood.  Full responsibility of the confidentiality of this discharge information lies with you  and/or your care-partner.

## 2022-09-12 NOTE — Progress Notes (Signed)
Called to room to assist during endoscopic procedure.  Patient ID and intended procedure confirmed with present staff. Received instructions for my participation in the procedure from the performing physician.  

## 2022-09-12 NOTE — Progress Notes (Signed)
Pt's states no medical or surgical changes since previsit or office visit. VS assessed by C.W 

## 2022-09-12 NOTE — Op Note (Signed)
[facility] Patient Name: Tammy Humphrey Procedure Date: 09/12/2022 10:48 AM MRN: [id number] Endoscopist: Jackquline Denmark , MD, [id number] Age: 72 Referring MD:  Date of Birth: September 14, 1950 Gender: Female Account #: [id number] Procedure:                Upper GI endoscopy Indications:              Heartburn. Occ N/V Medicines:                Monitored Anesthesia Care Procedure:                Pre-Anesthesia Assessment:                           - Prior to the procedure, a History and Physical                            was performed, and patient medications and                            allergies were reviewed. The patient's tolerance of                            previous anesthesia was also reviewed. The risks                            and benefits of the procedure and the sedation                            options and risks were discussed with the patient.                            All questions were answered, and informed consent                            was obtained. Prior Anticoagulants: The patient has                            taken no anticoagulant or antiplatelet agents. ASA                            Grade Assessment: II - A patient with mild systemic                            disease. After reviewing the risks and benefits,                            the patient was deemed in satisfactory condition to                            undergo the procedure.                           After obtaining informed consent, the endoscope was  passed under direct vision. Throughout the                            procedure, the patient's blood pressure, pulse, and                            oxygen saturations were monitored continuously. The                            Endoscope Olympus [id number] was introduced through                            the mouth, and advanced to the second part of                            duodenum. The upper GI endoscopy was  accomplished                            without difficulty. The patient tolerated the                            procedure well. Scope In: Scope Out: Findings:                 LA Grade B (one or more mucosal breaks greater than                            5 mm, not extending between the tops of two mucosal                            folds) esophagitis with no bleeding was found 35 to                            36 cm from the incisors. Biopsies were taken with a                            cold forceps for histology.                           A 2 cm hiatal hernia was present-extending from 36                            cm up to 38 cm                           Localized mild inflammation characterized by                            erythema was found in the gastric antrum. Biopsies                            were taken with a cold forceps for histology.  The examined duodenum was normal. Biopsies were                            taken with a cold forceps for histology. Complications:            No immediate complications. Estimated Blood Loss:     Estimated blood loss: none. Impression:               - LA Grade B reflux esophagitis with no bleeding.                            Biopsied.                           - 2 cm hiatal hernia.                           - Gastritis. Biopsied.                           - Normal examined duodenum. Biopsied. Recommendation:           - Patient has a contact number available for                            emergencies. The signs and symptoms of potential                            delayed complications were discussed with the                            patient. Return to normal activities tomorrow.                            Written discharge instructions were provided to the                            patient.                           - Resume previous diet.                           - She is very reluctant to start PPIs. Hence, we                             will do Pepcid 40 mg p.o. once a day                           - Await pathology results.                           - No aspirin, ibuprofen, naproxen, or other                            non-steroidal anti-inflammatory drugs.                           -  Follow an antireflux regimen.                           - The findings and recommendations were discussed                            with the patient's family. Jackquline Denmark, MD 09/12/2022 11:14:56 AM This report has been signed electronically.

## 2022-09-12 NOTE — Progress Notes (Signed)
Uneventful anesthetic. Report to pacu rn. Vss. Care resumed by rn. 

## 2022-09-12 NOTE — Progress Notes (Signed)
Chief Complaint: For reflux  Referring Provider:  Donella Stade, PA-C      ASSESSMENT AND PLAN;   #1. Long standing GERD.  Wants to avoid PPIs  #2. Occ N/V  Plan: -Pepcid 40mg  po QD #30, 6RF (before lunch) -EGD. -Brochures GERD.  Proceed with EGD. I have discussed the risks and benefits. The risks including rare risk of perforation, bleeding, missed UGI neoplasms, risks of anesthesia/sedation. Alternatives were given. Patient is aware and agrees to proceed. All the questions were answered. This will be scheduled in upcoming days. Consent forms were given for review.  He HPI:    Tammy Humphrey is a 72 y.o. female  With HLD, hypothyroidism, osteopenia   C/O GERD x 15 yrs, worse over last yr with  Heartburn, regurgitation, Occ N/V With cough Mostly late afternoon, after lunch Given prescription PPIs-she wants to avoid taking medications due to concerns regarding osteoporosis. Denies having any odynophagia or dysphagia. Had remote history of EGD @[facility]  with dilation over 10 yrs ago.  I could not find any report in Care Everywhere.  No abdominal pain.  She denies having any melena or weight loss  No sodas, chocolates, chewing gums, artificial sweeteners and candy. No NSAIDs  No diarrhea/constipation.  Has osteopenia-took prolia last year-stopped due to side effects.   Past GI workup:  -EGD with dil @[facility]  many years ago.  Was told that she also has hiatal hernia.  Given PPIs.  -Colon (Dr Sherral Hammers)- neg at  age 63.  Opted for Cologuard.  Last Cologuard 07/2021 neg. repeat in 3 years. Past Medical History:  Diagnosis Date   GERD (gastroesophageal reflux disease)    HLD (hyperlipidemia)    Hypothyroidism    Osteopenia    Status post dilation of esophageal narrowing     Past Surgical History:  Procedure Laterality Date   CESAREAN SECTION     x 2    Family History  Problem Relation Age of Onset   Hypertension Mother    Heart attack Father     Stroke Father    Diverticulitis Sister    Stomach cancer Maternal Grandfather    Heart attack Paternal Grandmother    Colon polyps Neg Hx    Rectal cancer Neg Hx    Esophageal cancer Neg Hx     Social History   Tobacco Use   Smoking status: Never   Smokeless tobacco: Never  Vaping Use   Vaping Use: Never used  Substance Use Topics   Alcohol use: No    Alcohol/week: 0.0 standard drinks of alcohol   Drug use: No    Current Outpatient Medications  Medication Sig Dispense Refill   Calcium 600-200 MG-UNIT tablet Take 1 tablet by mouth 2 (two) times daily.     famotidine (PEPCID) 40 MG tablet Take 1 tablet (40 mg total) by mouth daily. 30 tablet 6   levothyroxine (SYNTHROID) 75 MCG tablet Take 1 tablet (75 mcg total) by mouth daily before breakfast. 90 tablet 3   TURMERIC PO Take by mouth.     Current Facility-Administered Medications  Medication Dose Route Frequency Provider Last Rate Last Admin   0.9 %  sodium chloride infusion  500 mL Intravenous Once Jackquline Denmark, MD        Allergies  Allergen Reactions   Prolia [Denosumab]     Bone pain    Review of Systems:  Constitutional: Denies fever, chills, diaphoresis, appetite change and fatigue.  HEENT: neg Respiratory: Denies SOB, DOE, has occ cough,  no chest tightness,  and wheezing.   Cardiovascular: Denies chest pain, palpitations and leg swelling.  Genitourinary: Denies dysuria, urgency, frequency, hematuria, flank pain and difficulty urinating.  Musculoskeletal: Denies myalgias, back pain, joint swelling, arthralgias and gait problem.  Skin: No rash.  Neurological: Denies dizziness, seizures, syncope, weakness, light-headedness, numbness and headaches.  Hematological: Denies adenopathy. Easy bruising, personal or family bleeding history  Psychiatric/Behavioral: No anxiety or depression     Physical Exam:    BP 128/75   Pulse 76   Temp 98 F (36.7 C) (Skin)   Ht 5\' 2"  (1.575 m)   Wt 135 lb (61.2 kg)   SpO2  98%   BMI 24.69 kg/m  Wt Readings from Last 3 Encounters:  09/12/22 135 lb (61.2 kg)  08/17/22 135 lb 4 oz (61.3 kg)  07/28/22 137 lb (62.1 kg)   Constitutional:  Well-developed, in no acute distress. Psychiatric: Normal mood and affect. Behavior is normal. HEENT: Pupils normal.  Conjunctivae are normal. No scleral icterus.  Cardiovascular: Normal rate, regular rhythm. No edema Pulmonary/chest: Effort normal and breath sounds normal. No wheezing, rales or rhonchi. Abdominal: Soft, nondistended. Nontender. Bowel sounds active throughout. There are no masses palpable. No hepatomegaly. Rectal: Deferred Neurological: Alert and oriented to person place and time. Skin: Skin is warm and dry. No rashes noted.  Data Reviewed: I have personally reviewed following labs and imaging studies  CBC:    Latest Ref Rng & Units 09/08/2022    8:09 AM 05/10/2021    8:11 AM 03/16/2020   12:12 PM  CBC  WBC 3.8 - 10.8 Thousand/uL 4.2  5.9  8.0   Hemoglobin 11.7 - 15.5 g/dL 14.8  14.3  12.9   Hematocrit 35.0 - 45.0 % 44.8  42.9  38.5   Platelets 140 - 400 Thousand/uL 337  364  389     CMP:    Latest Ref Rng & Units 09/08/2022    8:09 AM 05/10/2021    8:11 AM 03/16/2020   12:12 PM  CMP  Glucose 65 - 99 mg/dL 86  95  102   BUN 7 - 25 mg/dL 22  13  14    Creatinine 0.60 - 1.00 mg/dL 0.97  0.95  0.83   Sodium 135 - 146 mmol/L 141  141  141   Potassium 3.5 - 5.3 mmol/L 5.0  5.1  4.3   Chloride 98 - 110 mmol/L 106  106  106   CO2 20 - 32 mmol/L 25  29  27    Calcium 8.6 - 10.4 mg/dL 9.6  9.4  8.6   Total Protein 6.1 - 8.1 g/dL 6.8  6.2  6.3   Total Bilirubin 0.2 - 1.2 mg/dL 1.0  1.1  0.6   AST 10 - 35 U/L 18  16  16    ALT 6 - 29 U/L 11  13  18        Radiology Studies: No results found.    Carmell Austria, MD 09/12/2022, 10:49 AM  Cc: Donella Stade, PA-C

## 2022-09-13 ENCOUNTER — Telehealth: Payer: Self-pay

## 2022-09-13 NOTE — Telephone Encounter (Signed)
Attempted f/u call. No answer, left VM. 

## 2022-09-17 ENCOUNTER — Encounter: Payer: Self-pay | Admitting: Gastroenterology

## 2022-10-01 ENCOUNTER — Ambulatory Visit
Admission: RE | Admit: 2022-10-01 | Discharge: 2022-10-01 | Disposition: A | Discharge status: Home / Self Care | Payer: PPO | Source: Ambulatory Visit | Attending: Family Medicine | Admitting: Family Medicine

## 2022-10-01 VITALS — BP 111/69 | HR 88 | Temp 99.3°F | Resp 14 | Ht 62.0 in | Wt 135.0 lb

## 2022-10-01 DIAGNOSIS — J069 Acute upper respiratory infection, unspecified: Secondary | ICD-10-CM

## 2022-10-01 LAB — POCT INFLUENZA A/B
Influenza A, POC: NEGATIVE
Influenza B, POC: NEGATIVE

## 2022-10-01 LAB — POC SARS CORONAVIRUS 2 AG -  ED: SARS Coronavirus 2 Ag: NEGATIVE

## 2022-10-01 MED ORDER — ACETAMINOPHEN 325 MG PO TABS
650.0000 mg | ORAL_TABLET | Freq: Once | ORAL | Status: AC
  Administered 2022-10-01: 650 mg via ORAL

## 2022-10-01 NOTE — ED Triage Notes (Signed)
Dry cough x 3 days  Body aches  Has  not checked temp - (thermometer not working) No OTC  pain meds  Sinus pressure

## 2022-10-01 NOTE — Discharge Instructions (Addendum)
The flu and COVID tests are negative You have an upper respiratory virus Take over-the-counter cough and cold medicines for your symptoms.  Get plenty of rest Drink lots of fluids See your doctor if not improving in several days

## 2022-10-01 NOTE — ED Provider Notes (Signed)
[facility]    CSN: [id number] Arrival date & time: 10/01/22  1318      History   Chief Complaint Chief Complaint  Patient presents with   Cough    HPI Tammy Humphrey is a 72 y.o. female.   HPI  Patient states she has headache and body ache.  Cough and congestion.  Feeling very tired.  She states that she can hear her breathing and has some wheezing and chest congestion.  No underlying lung disease or asthma.  Non-smoker.  No known exposure to illness.  Does not have allergy symptoms usually  Past Medical History:  Diagnosis Date   GERD (gastroesophageal reflux disease)    HLD (hyperlipidemia)    Hypothyroidism    Osteopenia    Status post dilation of esophageal narrowing     Patient Active Problem List   Diagnosis Date Noted   Arthralgia 03/16/2020   Hamstring muscle strain 03/16/2020   Neck pain 03/16/2020   Skin abrasion 03/04/2018   Hyperlipidemia 08/07/2014   IDA (iron deficiency anemia) 08/07/2014   Esophageal reflux 08/03/2014   Thyroid activity decreased 08/03/2014   Osteoporosis 08/03/2014    Past Surgical History:  Procedure Laterality Date   CESAREAN SECTION     x 2    OB History   No obstetric history on file.      Home Medications    Prior to Admission medications   Medication Sig Start Date End Date Taking? Authorizing Provider  Calcium 600-200 MG-UNIT tablet Take 1 tablet by mouth 2 (two) times daily.    [provider]  famotidine (PEPCID) 40 MG tablet Take 1 tablet (40 mg total) by mouth daily. 08/17/22   Lynann Bologna, MD  levothyroxine (SYNTHROID) 75 MCG tablet Take 1 tablet (75 mcg total) by mouth daily before breakfast. 09/11/22   Breeback, Jade L, PA-C  TURMERIC PO Take by mouth.    [provider]    Family History Family History  Problem Relation Age of Onset   Hypertension Mother    Heart attack Father    Stroke Father    Diverticulitis Sister    Stomach cancer Maternal Grandfather     Heart attack Paternal Grandmother    Colon polyps Neg Hx    Rectal cancer Neg Hx    Esophageal cancer Neg Hx     Social History Social History   Tobacco Use   Smoking status: Never   Smokeless tobacco: Never  Vaping Use   Vaping Use: Never used  Substance Use Topics   Alcohol use: No    Alcohol/week: 0.0 standard drinks of alcohol   Drug use: No     Allergies   Prolia [denosumab]   Review of Systems Review of Systems See HPI  Physical Exam Triage Vital Signs ED Triage Vitals  Enc Vitals Group     BP 10/01/22 1341 111/69     Pulse Rate 10/01/22 1341 88     Resp 10/01/22 1341 14     Temp 10/01/22 1341 99.3 F (37.4 C)     Temp Source 10/01/22 1341 Oral     SpO2 10/01/22 1341 96 %     Weight 10/01/22 1343 135 lb (61.2 kg)     Height 10/01/22 1343 5\' 2"  (1.575 m)     Head Circumference --      Peak Flow --      Pain Score 10/01/22 1343 5     Pain Loc --      Pain  Edu? --      Excl. in GC? --    No data found.  Updated Vital Signs BP 111/69 (BP Location: Right Arm)   Pulse 88   Temp 99.3 F (37.4 C) (Oral)   Resp 14   Ht  (1.575 m)   Wt 61.2 kg   SpO2 96%   BMI 24.69 kg/m       Physical Exam Constitutional:      General: She is not in acute distress.    Appearance: She is well-developed. She is ill-appearing.  HENT:     Head: Normocephalic and atraumatic.     Nose: No rhinorrhea.     Mouth/Throat:     Mouth: Mucous membranes are moist.     Pharynx: No posterior oropharyngeal erythema.  Eyes:     Conjunctiva/sclera: Conjunctivae normal.     Pupils: Pupils are equal, round, and reactive to light.  Cardiovascular:     Rate and Rhythm: Normal rate and regular rhythm.     Heart sounds: Normal heart sounds.  Pulmonary:     Effort: Pulmonary effort is normal. No respiratory distress.     Breath sounds: Rhonchi present. No wheezing.  Abdominal:     General: There is no distension.     Palpations: Abdomen is soft.  Musculoskeletal:         General: Normal range of motion.     Cervical back: Normal range of motion.  Skin:    General: Skin is warm and dry.  Neurological:     Mental Status: She is alert.      UC Treatments / Results  Labs (all labs ordered are listed, but only abnormal results are displayed) Labs Reviewed  POC SARS CORONAVIRUS 2 AG -  ED  POCT INFLUENZA A/B    EKG   Radiology No results found.  Procedures Procedures (including critical care time)  Medications Ordered in UC Medications  acetaminophen (TYLENOL) tablet 650 mg (650 mg Oral Given 10/01/22 1413)    Initial Impression / Assessment and Plan / UC Course  I have reviewed the triage vital signs and the nursing notes.  Pertinent labs & imaging results that were available during my care of the patient were reviewed by me and considered in my medical decision making (see chart for details).    Symptomatic care viral illness discussed Final Clinical Impressions(s) / UC Diagnoses   Final diagnoses:  Viral URI with cough     Discharge Instructions      The flu and COVID tests are negative You have an upper respiratory virus Take over-the-counter cough and cold medicines for your symptoms.  Get plenty of rest Drink lots of fluids See your doctor if not improving in several days      ED Prescriptions   None    PDMP not reviewed this encounter.   Eustace Moore, MD 10/01/22 [phone]

## 2022-11-28 DIAGNOSIS — Z1231 Encounter for screening mammogram for malignant neoplasm of breast: Secondary | ICD-10-CM | POA: Diagnosis not present

## 2022-11-28 LAB — HM MAMMOGRAPHY

## 2022-12-05 ENCOUNTER — Encounter: Payer: Self-pay | Admitting: Physician Assistant

## 2022-12-27 DIAGNOSIS — L718 Other rosacea: Secondary | ICD-10-CM | POA: Diagnosis not present

## 2022-12-27 DIAGNOSIS — L821 Other seborrheic keratosis: Secondary | ICD-10-CM | POA: Diagnosis not present

## 2022-12-27 DIAGNOSIS — Z85828 Personal history of other malignant neoplasm of skin: Secondary | ICD-10-CM | POA: Diagnosis not present

## 2022-12-27 DIAGNOSIS — Z129 Encounter for screening for malignant neoplasm, site unspecified: Secondary | ICD-10-CM | POA: Diagnosis not present

## 2022-12-27 DIAGNOSIS — L814 Other melanin hyperpigmentation: Secondary | ICD-10-CM | POA: Diagnosis not present

## 2023-01-23 ENCOUNTER — Telehealth: Payer: Self-pay | Admitting: *Deleted

## 2023-01-23 NOTE — Telephone Encounter (Signed)
I connected with Tammy Humphrey on 8/6 at 1113 by telephone and verified that I am speaking with the correct person using two identifiers. According to the patient's chart they are due for follow up with med center [facility]. Pt scheduled. There are no transportation issues at this time. Nothing further was needed at the end of our conversation.

## 2023-02-05 ENCOUNTER — Encounter: Payer: Self-pay | Admitting: Physician Assistant

## 2023-02-05 ENCOUNTER — Ambulatory Visit (INDEPENDENT_AMBULATORY_CARE_PROVIDER_SITE_OTHER): Payer: PPO | Admitting: Physician Assistant

## 2023-02-05 VITALS — BP 121/72 | HR 72 | Ht 62.0 in | Wt 122.0 lb

## 2023-02-05 DIAGNOSIS — M810 Age-related osteoporosis without current pathological fracture: Secondary | ICD-10-CM | POA: Diagnosis not present

## 2023-02-05 DIAGNOSIS — Z780 Asymptomatic menopausal state: Secondary | ICD-10-CM | POA: Diagnosis not present

## 2023-02-05 DIAGNOSIS — E782 Mixed hyperlipidemia: Secondary | ICD-10-CM | POA: Diagnosis not present

## 2023-02-05 DIAGNOSIS — D508 Other iron deficiency anemias: Secondary | ICD-10-CM

## 2023-02-05 NOTE — Progress Notes (Signed)
Established Patient Office Visit  Subjective   Patient ID: Tammy Humphrey, female    DOB: 06/24/1950  Age: 72 y.o. MRN: [id number]  Chief Complaint  Patient presents with   Medical Management of Chronic Issues    HPI Pt is a 72 yo female with Osteoporosis, HLD, IDA, hypothryoidism who presents to the clinic for follow up and medication refills.   She feels great. She is exercising. She is taking vitamin D and calcium but no bone density medication due to side effects of bone pain.   .. Active Ambulatory Problems    Diagnosis Date Noted   Esophageal reflux 08/03/2014   Thyroid activity decreased 08/03/2014   Osteoporosis 08/03/2014   Hyperlipidemia 08/07/2014   IDA (iron deficiency anemia) 08/07/2014   Skin abrasion 03/04/2018   Arthralgia 03/16/2020   Hamstring muscle strain 03/16/2020   Neck pain 03/16/2020   Resolved Ambulatory Problems    Diagnosis Date Noted   No Resolved Ambulatory Problems   Past Medical History:  Diagnosis Date   GERD (gastroesophageal reflux disease)    HLD (hyperlipidemia)    Hypothyroidism    Osteopenia    Status post dilation of esophageal narrowing      Review of Systems  All other systems reviewed and are negative.     Objective:     BP 121/72   Pulse 72   Ht 5\' 2"  (1.575 m)   Wt 122 lb (55.3 kg)   SpO2 99%   BMI 22.31 kg/m  BP Readings from Last 3 Encounters:  02/05/23 121/72  10/01/22 111/69  09/12/22 135/82   Wt Readings from Last 3 Encounters:  02/05/23 122 lb (55.3 kg)  10/01/22 135 lb (61.2 kg)  09/12/22 135 lb (61.2 kg)      Physical Exam Constitutional:      Appearance: Normal appearance.  HENT:     Head: Normocephalic.  Neck:     Vascular: No carotid bruit.  Cardiovascular:     Rate and Rhythm: Normal rate and regular rhythm.     Pulses: Normal pulses.     Heart sounds: Normal heart sounds.  Pulmonary:     Effort: Pulmonary effort is normal.     Breath sounds: Normal breath sounds.   Musculoskeletal:     Cervical back: Normal range of motion and neck supple. No rigidity or tenderness.     Right lower leg: No edema.     Left lower leg: No edema.  Lymphadenopathy:     Cervical: No cervical adenopathy.  Neurological:     General: No focal deficit present.     Mental Status: She is alert.  Psychiatric:        Mood and Affect: Mood normal.     The 10-year ASCVD risk score (Arnett DK, et al., 2019) is: 9.4%    Assessment & Plan:  Marland KitchenMarland KitchenMarisa was seen today for medical management of chronic issues.  Diagnoses and all orders for this visit:  Mixed hyperlipidemia -     Lipid panel -     CMP14+EGFR  Age-related osteoporosis without current pathological fracture -     TSH -     CMP14+EGFR -     VITAMIN D 25 Hydroxy (Vit-D Deficiency, Fractures)  Iron deficiency anemia secondary to inadequate dietary iron intake -     CMP14+EGFR -     CBC w/Diff/Platelet  Post-menopausal -     CMP14+EGFR -     VITAMIN D 25 Hydroxy (Vit-D Deficiency, Fractures)   Vitals look  great today Fasting labs ordered Continue levothyroxine daily Tscore -2.3 at last check, due in 02/2024 Continue vitamin D and calcium with low weight bearing exercise Will continue discussion of medications at next bone density    Tandy Gaw, PA-C

## 2023-02-06 DIAGNOSIS — M810 Age-related osteoporosis without current pathological fracture: Secondary | ICD-10-CM | POA: Diagnosis not present

## 2023-02-06 DIAGNOSIS — Z780 Asymptomatic menopausal state: Secondary | ICD-10-CM | POA: Diagnosis not present

## 2023-02-06 DIAGNOSIS — D508 Other iron deficiency anemias: Secondary | ICD-10-CM | POA: Diagnosis not present

## 2023-02-06 DIAGNOSIS — E782 Mixed hyperlipidemia: Secondary | ICD-10-CM | POA: Diagnosis not present

## 2023-02-07 ENCOUNTER — Encounter: Payer: Self-pay | Admitting: Physician Assistant

## 2023-02-07 LAB — CBC WITH DIFFERENTIAL/PLATELET
Basophils Absolute: 0.1 10*3/uL (ref 0.0–0.2)
Basos: 1 %
EOS (ABSOLUTE): 0.1 10*3/uL (ref 0.0–0.4)
Eos: 3 %
Hematocrit: 42.3 % (ref 34.0–46.6)
Hemoglobin: 14.5 g/dL (ref 11.1–15.9)
Immature Grans (Abs): 0 10*3/uL (ref 0.0–0.1)
Immature Granulocytes: 0 %
Lymphocytes Absolute: 2 10*3/uL (ref 0.7–3.1)
Lymphs: 45 %
MCH: 32.8 pg (ref 26.6–33.0)
MCHC: 34.3 g/dL (ref 31.5–35.7)
MCV: 96 fL (ref 79–97)
Monocytes Absolute: 0.5 10*3/uL (ref 0.1–0.9)
Monocytes: 11 %
Neutrophils Absolute: 1.7 10*3/uL (ref 1.4–7.0)
Neutrophils: 40 %
Platelets: 319 10*3/uL (ref 150–450)
RBC: 4.42 x10E6/uL (ref 3.77–5.28)
RDW: 11.6 % — ABNORMAL LOW (ref 11.7–15.4)
WBC: 4.4 10*3/uL (ref 3.4–10.8)

## 2023-02-07 LAB — CMP14+EGFR
ALT: 10 IU/L (ref 0–32)
AST: 18 IU/L (ref 0–40)
Albumin: 4.3 g/dL (ref 3.8–4.8)
Alkaline Phosphatase: 75 IU/L (ref 44–121)
BUN/Creatinine Ratio: 14 (ref 12–28)
BUN: 13 mg/dL (ref 8–27)
Bilirubin Total: 1.2 mg/dL (ref 0.0–1.2)
CO2: 23 mmol/L (ref 20–29)
Calcium: 9.6 mg/dL (ref 8.7–10.3)
Chloride: 103 mmol/L (ref 96–106)
Creatinine, Ser: 0.96 mg/dL (ref 0.57–1.00)
Globulin, Total: 2.1 g/dL (ref 1.5–4.5)
Glucose: 88 mg/dL (ref 70–99)
Potassium: 5 mmol/L (ref 3.5–5.2)
Sodium: 140 mmol/L (ref 134–144)
Total Protein: 6.4 g/dL (ref 6.0–8.5)
eGFR: 63 mL/min/{1.73_m2} (ref >59)

## 2023-02-07 LAB — LIPID PANEL
Chol/HDL Ratio: 2.4 ratio (ref 0.0–4.4)
Cholesterol, Total: 235 mg/dL — ABNORMAL HIGH (ref 100–199)
HDL: 97 mg/dL (ref >39)
LDL Chol Calc (NIH): 128 mg/dL — ABNORMAL HIGH (ref 0–99)
Triglycerides: 61 mg/dL (ref 0–149)
VLDL Cholesterol Cal: 10 mg/dL (ref 5–40)

## 2023-02-07 LAB — VITAMIN D 25 HYDROXY (VIT D DEFICIENCY, FRACTURES): Vit D, 25-Hydroxy: 58.7 ng/mL (ref 30.0–100.0)

## 2023-02-07 LAB — TSH: TSH: 0.873 u[IU]/mL (ref 0.450–4.500)

## 2023-02-07 NOTE — Progress Notes (Signed)
[place],   TG look great.  HDL, good cholesterol, is wonderful.  LDL not to optimal goal of under 100.  10 year risk is 9.1 percent and I would consider due to indication to start a statin to lower LDL and decrease CV risk. Thoughts?   Marland KitchenMarland KitchenThe 10-year ASCVD risk score (Arnett DK, et al., 2019) is: 9.1%   Values used to calculate the score:     Age: 72 years     Sex: Female     Is Non-Hispanic African American: No     Diabetic: No     Tobacco smoker: No     Systolic Blood Pressure: 121 mmHg     Is BP treated: No     HDL Cholesterol: 97 mg/dL     Total Cholesterol: 235 mg/dL  Kidney, liver, glucose look good.  Thyroid looks good.  Vitamin D looks great.

## 2023-02-09 MED ORDER — ATORVASTATIN CALCIUM 20 MG PO TABS: 20.0000 mg | ORAL_TABLET | Freq: Every day | ORAL | 3 refills | Status: DC

## 2023-03-13 DIAGNOSIS — Z961 Presence of intraocular lens: Secondary | ICD-10-CM | POA: Diagnosis not present

## 2023-03-13 DIAGNOSIS — H40013 Open angle with borderline findings, low risk, bilateral: Secondary | ICD-10-CM | POA: Diagnosis not present

## 2023-03-13 DIAGNOSIS — H04123 Dry eye syndrome of bilateral lacrimal glands: Secondary | ICD-10-CM | POA: Diagnosis not present

## 2023-03-13 DIAGNOSIS — H52203 Unspecified astigmatism, bilateral: Secondary | ICD-10-CM | POA: Diagnosis not present

## 2023-03-13 DIAGNOSIS — H524 Presbyopia: Secondary | ICD-10-CM | POA: Diagnosis not present

## 2023-03-13 DIAGNOSIS — H43813 Vitreous degeneration, bilateral: Secondary | ICD-10-CM | POA: Diagnosis not present

## 2023-07-02 DIAGNOSIS — Z85828 Personal history of other malignant neoplasm of skin: Secondary | ICD-10-CM | POA: Diagnosis not present

## 2023-07-02 DIAGNOSIS — L821 Other seborrheic keratosis: Secondary | ICD-10-CM | POA: Diagnosis not present

## 2023-07-02 DIAGNOSIS — L570 Actinic keratosis: Secondary | ICD-10-CM | POA: Diagnosis not present

## 2023-07-30 ENCOUNTER — Ambulatory Visit: Payer: PPO

## 2023-07-30 VITALS — BP 118/77 | HR 64 | Ht 63.0 in | Wt 119.0 lb

## 2023-07-30 DIAGNOSIS — Z0000 Encounter for general adult medical examination without abnormal findings: Secondary | ICD-10-CM

## 2023-07-30 NOTE — Patient Instructions (Signed)
  Ms. Fabrizio , Thank you for taking time to come for your Medicare Wellness Visit. I appreciate your ongoing commitment to your health goals. Please review the following plan we discussed and let me know if I can assist you in the future.   These are the goals we discussed:  Goals       Healthy Nutrition Achieved      She would like to continue to maintain a healthy lifestyle.       Patient Stated      Patient Stated (pt-stated)      Patient would like to continue to maintain her healthy lifestyle.      Weight (lb) < 200 lb (90.7 kg)      Patient stated would like to loose 5 pounds.      Weight (lb) < 200 lb (90.7 kg)      Patient would like to lose 5 lbs over the next year.         This is a list of the screening recommended for you and due dates:  Health Maintenance  Topic Date Due   COVID-19 Vaccine (4 - 2024-25 season) 02/18/2023   DEXA scan (bone density measurement)  03/09/2024   Cologuard (Stool DNA test)  07/27/2024   Medicare Annual Wellness Visit  07/29/2024   Mammogram  11/27/2024   DTaP/Tdap/Td vaccine (2 - Td or Tdap) 03/04/2028   Pneumonia Vaccine  Completed   Flu Shot  Completed   Hepatitis C Screening  Completed   Zoster (Shingles) Vaccine  Completed   HPV Vaccine  Aged Out   Colon Cancer Screening  Discontinued

## 2023-07-30 NOTE — Progress Notes (Signed)
 Subjective:   Tammy Humphrey is a 73 y.o. female who presents for Medicare Annual (Subsequent) preventive examination.  Visit Complete: In person  Patient Medicare AWV questionnaire was completed by the patient on 07/29/2023; I have confirmed that all information answered by patient is correct and no changes since this date.  Cardiac Risk Factors include: advanced age (>56men, >59 women);dyslipidemia     Objective:    Today's Vitals   07/30/23 1500  BP: 118/77  Pulse: 64  SpO2: 100%  Weight: 119 lb (54 kg)  Height: 5\' 3"  (1.6 m)   Body mass index is 21.08 kg/m.     07/30/2023    3:09 PM 07/28/2022    2:07 PM 07/22/2021    3:02 PM 01/29/2020    1:11 PM 01/14/2019    8:12 AM 08/03/2014    1:25 PM  Advanced Directives  Does Patient Have a Medical Advance Directive? No No No No No Yes;No  Does patient want to make changes to medical advance directive?      No - Patient declined  Would patient like information on creating a medical advance directive? No - Patient declined No - Patient declined No - Patient declined Yes (MAU/Ambulatory/Procedural Areas - Information given) No - Patient declined     Current Medications (verified) Outpatient Encounter Medications as of 07/30/2023  Medication Sig   atorvastatin  (LIPITOR) 20 MG tablet Take 1 tablet (20 mg total) by mouth at bedtime.   Calcium  600-200 MG-UNIT tablet Take 1 tablet by mouth 2 (two) times daily.   levothyroxine  (SYNTHROID ) 75 MCG tablet Take 1 tablet (75 mcg total) by mouth daily before breakfast.   famotidine  (PEPCID ) 40 MG tablet Take 1 tablet (40 mg total) by mouth daily. (Patient not taking: Reported on 07/30/2023)   TURMERIC PO Take by mouth. (Patient not taking: Reported on 07/30/2023)   No facility-administered encounter medications on file as of 07/30/2023.    Allergies (verified) Prolia  [denosumab ]   History: Past Medical History:  Diagnosis Date   GERD (gastroesophageal reflux disease)    HLD  (hyperlipidemia)    Hypothyroidism    Osteopenia    Status post dilation of esophageal narrowing    Past Surgical History:  Procedure Laterality Date   CESAREAN SECTION     x 2   Family History  Problem Relation Age of Onset   Hypertension Mother    Heart attack Father    Stroke Father    Diverticulitis Sister    Stomach cancer Maternal Grandfather    Heart attack Paternal Grandmother    Colon polyps Neg Hx    Rectal cancer Neg Hx    Esophageal cancer Neg Hx    Social History   Socioeconomic History   Marital status: Widowed    Spouse name: Not on file   Number of children: 2   Years of education: 51   Highest education level: Some college, no degree  Occupational History   Occupation: Education officer, 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 She enjoys walking and being outside.   Social Drivers of Health  Financial Resource Strain: Low Risk  (07/30/2023)   Overall Financial Resource Strain (CARDIA)    Difficulty of Paying Living Expenses: Not hard at all  Food Insecurity: No Food Insecurity (07/30/2023)   Hunger Vital Sign    Worried About Running Out of Food in the Last Year: Never true    Ran Out of Food in the Last Year: Never true  Transportation Needs: No Transportation Needs (07/30/2023)   PRAPARE - Administrator, Civil Service (Medical): No    Lack of Transportation (Non-Medical): No  Physical Activity: Sufficiently Active (07/30/2023)   Exercise Vital Sign    Days of Exercise per Week: 7 days    Minutes of Exercise per Session: 50 min  Stress: No Stress Concern Present (07/30/2023)   [employer] of Occupational  Health - Occupational Stress Questionnaire    Feeling of Stress : Not at all  Social Connections: Socially Isolated (07/30/2023)   Social Connection and Isolation Panel [NHANES]    Frequency of Communication with Friends and Family: Three times a week    Frequency of Social Gatherings with Friends and Family: Twice a week    Attends Religious Services: Never    Database administrator or Organizations: No    Attends Banker Meetings: Never    Marital Status: Widowed    Tobacco Counseling Counseling given: Not Answered   Clinical Intake:  Pre-visit preparation completed: Yes  Pain : No/denies pain     BMI - recorded: 21.08 Nutritional Status: BMI of 19-24  Normal Nutritional Risks: None Diabetes: No  How often do you need to have someone help you when you read instructions, pamphlets, or other written materials from your doctor or pharmacy?: 1 - Never What is the last grade level you completed in school?: 12  Interpreter Needed?: No      Activities of Daily Living    07/30/2023    3:02 PM  In your present state of health, do you have any difficulty performing the following activities:  Hearing? 0  Vision? 0  Difficulty concentrating or making decisions? 0  Walking or climbing stairs? 0  Dressing or bathing? 0  Doing errands, shopping? 0  Preparing Food and eating ? N  Using the Toilet? N  In the past six months, have you accidently leaked urine? N  Do you have problems with loss of bowel control? N  Managing your Medications? N  Managing your Finances? N  Housekeeping or managing your Housekeeping? N    Patient Care Team: Breeback, Jade L, PA-C as PCP - General (Family Medicine) Lajuan Pila, MD as Consulting Physician (Gastroenterology)  Indicate any recent Medical Services you may have received from other than [facility] providers in the past year (date may be approximate).     Assessment:   This is a routine wellness examination for  Tammy Humphrey.  Hearing/Vision screen Vision Screening   Right eye Left eye Both eyes  Without correction     With correction   20/20  Hearing Screening - Comments:: Not tested   Goals Addressed             This Visit's Progress    Healthy Nutrition Achieved       She would like to continue to maintain a healthy lifestyle.       Depression Screen    07/30/2023    3:08 PM 02/05/2023    1:30 PM 07/28/2022    2:08 PM 07/22/2021    3:03 PM 05/20/2021  5:49 AM 01/29/2020    1:12 PM 01/14/2019    8:11 AM  PHQ 2/9 Scores  PHQ - 2 Score 0 0 0 0 0 0 0    Fall Risk    07/30/2023    3:10 PM 02/05/2023    1:31 PM 07/28/2022    2:08 PM 07/22/2021    3:03 PM 01/29/2020    1:12 PM  Fall Risk   Falls in the past year? 0 1 0 0 0  Number falls in past yr: 0 1 0 0 0  Injury with Fall? 0 0 0 0   Risk for fall due to : No Fall Risks  No Fall Risks No Fall Risks   Follow up Falls evaluation completed  Falls evaluation completed Education provided;Falls evaluation completed Falls evaluation completed    MEDICARE RISK AT HOME: Medicare Risk at Home Any stairs in or around the home?: Yes If so, are there any without handrails?: No Home free of loose throw rugs in walkways, pet beds, electrical cords, etc?: Yes Adequate lighting in your home to reduce risk of falls?: Yes Life alert?: No Use of a cane, walker or w/c?: No Grab bars in the bathroom?: Yes Shower chair or bench in shower?: No Elevated toilet seat or a handicapped toilet?: No  TIMED UP AND GO:  Was the test performed?  Yes  Length of time to ambulate 10 feet: 11 sec Gait steady and fast without use of assistive device    Cognitive Function:        07/30/2023    3:11 PM 07/28/2022    2:13 PM 07/22/2021    3:08 PM 01/29/2020    1:15 PM 01/14/2019    8:17 AM  6CIT Screen  What Year? 0 points 0 points 0 points 0 points 0 points  What month? 0 points 0 points 0 points 0 points 0 points  What time? 0 points 0 points 0 points 0 points 0  points  Count back from 20 0 points 0 points 0 points 0 points 0 points  Months in reverse 0 points 0 points 0 points 0 points 0 points  Repeat phrase 2 points 0 points 0 points 0 points 0 points  Total Score 2 points 0 points 0 points 0 points 0 points    Immunizations Immunization History  Administered Date(s) Administered   Influenza, High Dose Seasonal PF 04/11/2018, 04/16/2019, 04/19/2022, 04/18/2023   Influenza,inj,Quad PF,6+ Mos 04/11/2014   Influenza,inj,quad, With Preservative 04/01/2014   Influenza-Unspecified 05/03/2015, 04/06/2016, 04/19/2018, 04/21/2021   Moderna Sars-Covid-2 Vaccination 07/15/2019, 08/12/2019, 04/13/2020   Pneumococcal Conjugate-13 05/03/2015   Pneumococcal Polysaccharide-23 05/17/2016, 03/10/2020   Tdap 03/04/2018   Zoster Recombinant(Shingrix) 08/26/2020, 11/26/2020    TDAP status: Up to date  Flu Vaccine status: Up to date  Pneumococcal vaccine status: Declined,  Education has been provided regarding the importance of this vaccine but patient still declined. Advised may receive this vaccine at local pharmacy or Health Dept. Aware to provide a copy of the vaccination record if obtained from local pharmacy or Health Dept. Verbalized acceptance and understanding.   Covid-19 vaccine status: Declined, Education has been provided regarding the importance of this vaccine but patient still declined. Advised may receive this vaccine at local pharmacy or Health Dept.or vaccine clinic. Aware to provide a copy of the vaccination record if obtained from local pharmacy or Health Dept. Verbalized acceptance and understanding.  Qualifies for Shingles Vaccine? Yes   Zostavax completed No   Shingrix Completed?: Yes  Screening Tests Health Maintenance  Topic Date Due   COVID-19 Vaccine (4 - 2024-25 season) 02/18/2023   DEXA SCAN  03/09/2024   Fecal DNA (Cologuard)  07/27/2024   Medicare Annual Wellness (AWV)  07/29/2024   MAMMOGRAM  11/27/2024   DTaP/Tdap/Td (2  - Td or Tdap) 03/04/2028   Pneumonia Vaccine 23+ Years old  Completed   INFLUENZA VACCINE  Completed   Hepatitis C Screening  Completed   Zoster Vaccines- Shingrix  Completed   HPV VACCINES  Aged Out   Colonoscopy  Discontinued    Health Maintenance  Health Maintenance Due  Topic Date Due   COVID-19 Vaccine (4 - 2024-25 season) 02/18/2023    Colorectal cancer screening: Type of screening: Cologuard. Completed 07/27/2021. Repeat every 3 years  Mammogram status: Completed 11/28/2022. Repeat every year  Bone Density status: Ordered 07/30/2023. Pt provided with contact info and advised to call to schedule appt.  Lung Cancer Screening: (Low Dose CT Chest recommended if Age 55-80 years, 20 pack-year currently smoking OR have quit w/in 15years.) does not qualify.   Lung Cancer Screening Referral: n/a  Additional Screening:  Hepatitis C Screening: does qualify; Completed 03/04/2018  Vision Screening: Recommended annual ophthalmology exams for early detection of glaucoma and other disorders of the eye. Is the patient up to date with their annual eye exam?  Yes  Who is the provider or what is the name of the office in which the patient attends annual eye exams? [facility]  If pt is not established with a provider, would they like to be referred to a provider to establish care? No .   Dental Screening: Recommended annual dental exams for proper oral hygiene   Community Resource Referral / Chronic Care Management: CRR required this visit?  No   CCM required this visit?  No     Plan:     I have personally reviewed and noted the following in the patient's chart:   Medical and social history Use of alcohol, tobacco or illicit drugs  Current medications and supplements including opioid prescriptions. Patient is not currently taking opioid prescriptions. Functional ability and status Nutritional status Physical activity Advanced directives List of other  physicians Hospitalizations, surgeries, and ER visits in previous 12 months No Vitals Screenings to include cognitive, depression, and falls Referrals and appointments  In addition, I have reviewed and discussed with patient certain preventive protocols, quality metrics, and best practice recommendations. A written personalized care plan for preventive services as well as general preventive health recommendations were provided to patient.     Aubrey Leaf, CMA   07/30/2023   After Visit Summary: (In Person-Printed) AVS printed and given to the patient  Nurse Notes:   Tammy Humphrey is here for [facility] Wellness Visits. She enjoys spending time with her grand kids, family and friends. She also enjoys walking outside daily.   Bone Density ordered today.

## 2023-10-31 ENCOUNTER — Other Ambulatory Visit: Payer: Self-pay | Admitting: Physician Assistant

## 2023-10-31 DIAGNOSIS — E039 Hypothyroidism, unspecified: Secondary | ICD-10-CM

## 2023-12-24 DIAGNOSIS — D485 Neoplasm of uncertain behavior of skin: Secondary | ICD-10-CM | POA: Diagnosis not present

## 2023-12-24 DIAGNOSIS — L718 Other rosacea: Secondary | ICD-10-CM | POA: Diagnosis not present

## 2023-12-24 DIAGNOSIS — D235 Other benign neoplasm of skin of trunk: Secondary | ICD-10-CM | POA: Diagnosis not present

## 2023-12-24 DIAGNOSIS — C4362 Malignant melanoma of left upper limb, including shoulder: Secondary | ICD-10-CM | POA: Diagnosis not present

## 2023-12-24 DIAGNOSIS — L814 Other melanin hyperpigmentation: Secondary | ICD-10-CM | POA: Diagnosis not present

## 2023-12-24 DIAGNOSIS — L570 Actinic keratosis: Secondary | ICD-10-CM | POA: Diagnosis not present

## 2023-12-24 DIAGNOSIS — Z129 Encounter for screening for malignant neoplasm, site unspecified: Secondary | ICD-10-CM | POA: Diagnosis not present

## 2023-12-24 DIAGNOSIS — Z85828 Personal history of other malignant neoplasm of skin: Secondary | ICD-10-CM | POA: Diagnosis not present

## 2023-12-24 DIAGNOSIS — L821 Other seborrheic keratosis: Secondary | ICD-10-CM | POA: Diagnosis not present

## 2023-12-24 DIAGNOSIS — L820 Inflamed seborrheic keratosis: Secondary | ICD-10-CM | POA: Diagnosis not present

## 2024-01-14 DIAGNOSIS — C4362 Malignant melanoma of left upper limb, including shoulder: Secondary | ICD-10-CM | POA: Diagnosis not present

## 2024-01-29 DIAGNOSIS — Z124 Encounter for screening for malignant neoplasm of cervix: Secondary | ICD-10-CM | POA: Diagnosis not present

## 2024-01-29 DIAGNOSIS — Z1231 Encounter for screening mammogram for malignant neoplasm of breast: Secondary | ICD-10-CM | POA: Diagnosis not present

## 2024-01-29 DIAGNOSIS — Z01419 Encounter for gynecological examination (general) (routine) without abnormal findings: Secondary | ICD-10-CM | POA: Diagnosis not present

## 2024-01-29 LAB — HM MAMMOGRAPHY

## 2024-01-30 ENCOUNTER — Other Ambulatory Visit: Payer: Self-pay | Admitting: Physician Assistant

## 2024-01-30 ENCOUNTER — Ambulatory Visit: Payer: Self-pay

## 2024-01-30 NOTE — Telephone Encounter (Signed)
 FYI Only or Action Required?: FYI only for provider.  Patient was last seen in primary care on 02/05/2023 by Antoniette Vermell CROME, PA-C.  Called Nurse Triage reporting Foot Injury.  Symptoms began several weeks ago.  Interventions attempted: Rest, hydration, or home remedies, Ice/heat application, and Other: wrapping in bandage/compression.  Symptoms are: left foot injury from a slip/missed step with left foot pain/swelling/bruising gradually improving.  Triage Disposition: See PCP When Office is Open (Within 3 Days)  Patient/caregiver understands and will follow disposition?: Yes                  Copied from CRM #[id number]. Topic: Clinical - Red Word Triage >> Jan 30, 2024  2:50 PM Fredrica W wrote: Red Word that prompted transfer to Nurse Triage: injured foot, swelling today really bad. Wants to make sure nothing is broken. Painful as well Reason for Disposition  [1] After 2 weeks AND [2] still painful or swollen  Answer Assessment - Initial Assessment Questions 1. MECHANISM: How did the injury happen? (e.g., twisting injury, direct blow)      She states she slipped on a step and her top of her foot twisted and landed on the next step. She states at the time it was very painful. She states she did ice and wrapping the foot.  2. ONSET: When did the injury happen? (e.g., minutes or hours ago)      July 31st.  3. LOCATION: Where is the injury located?      Left foot.  4. APPEARANCE of INJURY: What does the injury look like?      Swelling (mild to moderate), bruising around heel and toes (purple that is improving, she states her toes were almost black)  5. WEIGHT-BEARING: Can you put weight on that foot? Can you walk (four steps or more)?       Yes.  6. SIZE: For cuts, bruises, or swelling, ask: How large is it? (e.g., inches or centimeters;  entire joint)      See #4.  7. PAIN: Is there pain? If Yes, ask: How bad is the pain? What does it keep you  from doing? (Scale 0-10; or none, mild, moderate, severe)     Yes. 5/10, intermittent throbbing.  8. TETANUS: For any breaks in the skin, ask: When was your last tetanus booster?     N/A no open wounds/gashes/cuts.  9. OTHER SYMPTOMS: Do you have any other symptoms?      Intermittent numbness in foot.  10. PREGNANCY: Is there any chance you are pregnant? When was your last menstrual period?       N/A.  Protocols used: Foot Injury-A-AH

## 2024-02-01 ENCOUNTER — Ambulatory Visit (INDEPENDENT_AMBULATORY_CARE_PROVIDER_SITE_OTHER): Admitting: Medical-Surgical

## 2024-02-01 ENCOUNTER — Encounter: Payer: Self-pay | Admitting: Medical-Surgical

## 2024-02-01 VITALS — BP 110/68 | HR 74 | Resp 20 | Ht 63.0 in | Wt 116.4 lb

## 2024-02-01 DIAGNOSIS — S99922A Unspecified injury of left foot, initial encounter: Secondary | ICD-10-CM | POA: Diagnosis not present

## 2024-02-01 MED ORDER — ATORVASTATIN CALCIUM 20 MG PO TABS: 20.0000 mg | ORAL_TABLET | Freq: Every day | ORAL | 0 refills | Status: DC

## 2024-02-01 NOTE — Progress Notes (Signed)
        Established patient visit   History of Present Illness   Discussed the use of AI scribe software for clinical note transcription with the patient, who gave verbal consent to proceed.  History of Present Illness   Tammy Humphrey is a 73 year old female who presents with ankle pain and swelling following a fall.  Ankle pain and swelling - Twisting injury to the ankle on July 31st after falling while descending stairs in the dark - Throbbing pain when sitting, localized across the front of the ankle from side to side - Able to ambulate on the affected ankle - Initial swelling and ecchymosis present, with improvement in swelling over time - Ankle remains somewhat swollen and uncomfortable - No popping, clicking, or locking of the joint - Intermittent icing and use of an Ace wrap for compression - Did not seek evaluation at the time of the injury     Physical Exam   Physical Exam Vitals reviewed.  Constitutional:      General: She is not in acute distress.    Appearance: Normal appearance.  HENT:     Head: Normocephalic and atraumatic.  Cardiovascular:     Rate and Rhythm: Normal rate and regular rhythm.     Pulses: Normal pulses.     Heart sounds: Normal heart sounds. No murmur heard.    No friction rub. No gallop.  Pulmonary:     Effort: Pulmonary effort is normal. No respiratory distress.     Breath sounds: Normal breath sounds. No wheezing.  Musculoskeletal:     Left ankle: Swelling and ecchymosis present. Tenderness present. Normal range of motion.     Left foot: Swelling present.  Skin:    General: Skin is warm and dry.  Neurological:     Mental Status: She is alert and oriented to person, place, and time.  Psychiatric:        Mood and Affect: Mood normal.        Behavior: Behavior normal.        Thought Content: Thought content normal.        Judgment: Judgment normal.    Assessment & Plan   Assessment and Plan    Ankle sprain Sustained ankle sprain  with swelling and pain, low fracture suspicion. Decision to monitor symptoms without x-ray. - Advise ice and compression with ACE wrap as needed. - Recommend elevation to reduce swelling. - Instruct on ankle exercises to improve mobility. - Advise monitoring symptoms and contact if swelling or pain persists for potential x-ray.  Hyperlipidemia Managed with atorvastatin , prescription renewal needed. Lab work due for follow-up. - Send 90-day refill of atorvastatin  to [facility]. - Schedule follow-up appointment for lab work and evaluation.      Follow up   Return if symptoms worsen or fail to improve.  __________________________________ Zada FREDRIK Palin, DNP, APRN, FNP-BC Primary Care and Sports Medicine [facility] [place]

## 2024-02-04 DIAGNOSIS — C4362 Malignant melanoma of left upper limb, including shoulder: Secondary | ICD-10-CM | POA: Diagnosis not present

## 2024-02-04 DIAGNOSIS — L905 Scar conditions and fibrosis of skin: Secondary | ICD-10-CM | POA: Diagnosis not present

## 2024-02-25 ENCOUNTER — Ambulatory Visit (INDEPENDENT_AMBULATORY_CARE_PROVIDER_SITE_OTHER): Admitting: Physician Assistant

## 2024-02-25 ENCOUNTER — Encounter: Payer: Self-pay | Admitting: Physician Assistant

## 2024-02-25 VITALS — BP 113/76 | HR 74 | Ht 63.0 in | Wt 115.0 lb

## 2024-02-25 DIAGNOSIS — E782 Mixed hyperlipidemia: Secondary | ICD-10-CM | POA: Diagnosis not present

## 2024-02-25 DIAGNOSIS — Z131 Encounter for screening for diabetes mellitus: Secondary | ICD-10-CM | POA: Diagnosis not present

## 2024-02-25 DIAGNOSIS — D508 Other iron deficiency anemias: Secondary | ICD-10-CM | POA: Diagnosis not present

## 2024-02-25 DIAGNOSIS — C439 Malignant melanoma of skin, unspecified: Secondary | ICD-10-CM | POA: Diagnosis not present

## 2024-02-25 DIAGNOSIS — M810 Age-related osteoporosis without current pathological fracture: Secondary | ICD-10-CM

## 2024-02-25 DIAGNOSIS — Z0000 Encounter for general adult medical examination without abnormal findings: Secondary | ICD-10-CM

## 2024-02-25 DIAGNOSIS — E039 Hypothyroidism, unspecified: Secondary | ICD-10-CM

## 2024-02-25 MED ORDER — ATORVASTATIN CALCIUM 20 MG PO TABS
20.0000 mg | ORAL_TABLET | Freq: Every day | ORAL | 4 refills | Status: AC
Start: 2024-02-25 — End: ?

## 2024-02-25 NOTE — Patient Instructions (Signed)
 Bone density ordered.  Keep up the good work Labs ordered today  Health Maintenance After Age 72 After age 21, you are at a higher risk for certain long-term diseases and infections as well as injuries from falls. Falls are a major cause of broken bones and head injuries in people who are older than age 60. Getting regular preventive care can help to keep you healthy and well. Preventive care includes getting regular testing and making lifestyle changes as recommended by your health care provider. Talk with your health care provider about: Which screenings and tests you should have. A screening is a test that checks for a disease when you have no symptoms. A diet and exercise plan that is right for you. What should I know about screenings and tests to prevent falls? Screening and testing are the best ways to find a health problem early. Early diagnosis and treatment give you the best chance of managing medical conditions that are common after age 68. Certain conditions and lifestyle choices may make you more likely to have a fall. Your health care provider may recommend: Regular vision checks. Poor vision and conditions such as cataracts can make you more likely to have a fall. If you wear glasses, make sure to get your prescription updated if your vision changes. Medicine review. Work with your health care provider to regularly review all of the medicines you are taking, including over-the-counter medicines. Ask your health care provider about any side effects that may make you more likely to have a fall. Tell your health care provider if any medicines that you take make you feel dizzy or sleepy. Strength and balance checks. Your health care provider may recommend certain tests to check your strength and balance while standing, walking, or changing positions. Foot health exam. Foot pain and numbness, as well as not wearing proper footwear, can make you more likely to have a fall. Screenings,  including: Osteoporosis screening. Osteoporosis is a condition that causes the bones to get weaker and break more easily. Blood pressure screening. Blood pressure changes and medicines to control blood pressure can make you feel dizzy. Depression screening. You may be more likely to have a fall if you have a fear of falling, feel depressed, or feel unable to do activities that you used to do. Alcohol use screening. Using too much alcohol can affect your balance and may make you more likely to have a fall. Follow these instructions at home: Lifestyle Do not drink alcohol if: Your health care provider tells you not to drink. If you drink alcohol: Limit how much you have to: 0-1 drink a day for women. 0-2 drinks a day for men. Know how much alcohol is in your drink. In the [place] one drink equals one 12 oz bottle of beer (355 mL), one 5 oz glass of wine (148 mL), or one 1 oz glass of hard liquor (44 mL). Do not use any products that contain nicotine or tobacco. These products include cigarettes, chewing tobacco, and vaping devices, such as e-cigarettes. If you need help quitting, ask your health care provider. Activity  Follow a regular exercise program to stay fit. This will help you maintain your balance. Ask your health care provider what types of exercise are appropriate for you. If you need a cane or walker, use it as recommended by your health care provider. Wear supportive shoes that have nonskid soles. Safety  Remove any tripping hazards, such as rugs, cords, and clutter. Install safety equipment such as  grab bars in bathrooms and safety rails on stairs. Keep rooms and walkways well-lit. General instructions Talk with your health care provider about your risks for falling. Tell your health care provider if: You fall. Be sure to tell your health care provider about all falls, even ones that seem minor. You feel dizzy, tiredness (fatigue), or off-balance. Take over-the-counter and  prescription medicines only as told by your health care provider. These include supplements. Eat a healthy diet and maintain a healthy weight. A healthy diet includes low-fat dairy products, low-fat (lean) meats, and fiber from whole grains, beans, and lots of fruits and vegetables. Stay current with your vaccines. Schedule regular health, dental, and eye exams. Summary Having a healthy lifestyle and getting preventive care can help to protect your health and wellness after age 72. Screening and testing are the best way to find a health problem early and help you avoid having a fall. Early diagnosis and treatment give you the best chance for managing medical conditions that are more common for people who are older than age 74. Falls are a major cause of broken bones and head injuries in people who are older than age 59. Take precautions to prevent a fall at home. Work with your health care provider to learn what changes you can make to improve your health and wellness and to prevent falls. This information is not intended to replace advice given to you by your health care provider. Make sure you discuss any questions you have with your health care provider. Document Revised: 10/25/2020 Document Reviewed: 10/25/2020 Elsevier Patient Education  2024 [employer].

## 2024-02-25 NOTE — Progress Notes (Unsigned)
   Complete physical exam  Patient: Tammy Humphrey   DOB: 09-30-1950   73 y.o. Female  MRN: [id number]  Subjective:    Chief Complaint  Patient presents with   Annual Exam    Aaren Krog is a 73 y.o. female who presents today for a complete physical exam. She reports consuming a {diet types:17450} diet. {types:19826} She generally feels {DESC; WELL/FAIRLY WELL/POORLY:18703}. She reports sleeping {DESC; WELL/FAIRLY WELL/POORLY:18703}. She {does/does not:200015} have additional problems to discuss today.    Most recent fall risk assessment:    07/30/2023    3:10 PM  Fall Risk   Falls in the past year? 0  Number falls in past yr: 0  Injury with Fall? 0  Risk for fall due to : No Fall Risks  Follow up Falls evaluation completed     Most recent depression screenings:    07/30/2023    3:08 PM 02/05/2023    1:30 PM  PHQ 2/9 Scores  PHQ - 2 Score 0 0    {VISON DENTAL STD PSA (Optional):27386}  {History (Optional):23778}  Patient Care Team: Jesper Stirewalt L, PA-C as PCP - General (Family Medicine)   Outpatient Medications Prior to Visit  Medication Sig   atorvastatin  (LIPITOR) 20 MG tablet Take 1 tablet (20 mg total) by mouth at bedtime. NEEDS APPT FOR FURTHER REFILLS   Calcium  600-200 MG-UNIT tablet Take 1 tablet by mouth 2 (two) times daily.   levothyroxine  (SYNTHROID ) 75 MCG tablet TAKE 1 TABLET BY MOUTH EVERY DAY BEFORE BREAKFAST   No facility-administered medications prior to visit.    ROS        Objective:     BP 113/76   Pulse 74   Ht 5' 3 (1.6 m)   Wt 115 lb (52.2 kg)   SpO2 99%   BMI 20.37 kg/m  {Vitals History (Optional):23777}  Physical Exam   No results found for any visits on 02/25/24. {Show previous labs (optional):23779}    Assessment & Plan:    Routine Health Maintenance and Physical Exam  Immunization History  Administered Date(s) Administered   INFLUENZA, HIGH DOSE SEASONAL PF 04/11/2018, 04/16/2019, 04/19/2022, 04/18/2023    Influenza,inj,Quad PF,6+ Mos 04/11/2014   Influenza,inj,quad, With Preservative 04/01/2014   Influenza-Unspecified 05/03/2015, 04/06/2016, 04/19/2018, 04/21/2021   Moderna Sars-Covid-2 Vaccination 07/15/2019, 08/12/2019, 04/13/2020   Pneumococcal Conjugate-13 05/03/2015   Pneumococcal Polysaccharide-23 05/17/2016, 03/10/2020   Tdap 03/04/2018   Zoster Recombinant(Shingrix) 08/26/2020, 11/26/2020    Health Maintenance  Topic Date Due   COVID-19 Vaccine (4 - 2025-26 season) 02/18/2024   Influenza Vaccine  09/16/2024 (Originally 01/18/2024)   DEXA SCAN  03/09/2024   Fecal DNA (Cologuard)  07/27/2024   Medicare Annual Wellness (AWV)  07/29/2024   MAMMOGRAM  11/27/2024   DTaP/Tdap/Td (2 - Td or Tdap) 03/04/2028   Pneumococcal Vaccine: 50+ Years  Completed   Hepatitis C Screening  Completed   Zoster Vaccines- Shingrix  Completed   HPV VACCINES  Aged Out   Meningococcal B Vaccine  Aged Out   Colonoscopy  Discontinued    Discussed health benefits of physical activity, and encouraged her to engage in regular exercise appropriate for her age and condition.  Problem List Items Addressed This Visit   None Visit Diagnoses       Encounter for annual physical exam    -  Primary      No follow-ups on file.     Nancie Bocanegra, PA-C

## 2024-02-26 ENCOUNTER — Encounter: Payer: Self-pay | Admitting: Physician Assistant

## 2024-02-26 ENCOUNTER — Ambulatory Visit: Payer: Self-pay | Admitting: Physician Assistant

## 2024-02-26 DIAGNOSIS — E039 Hypothyroidism, unspecified: Secondary | ICD-10-CM

## 2024-02-26 LAB — TSH+FREE T4
Free T4: 1.28 ng/dL (ref 0.82–1.77)
TSH: 1.55 u[IU]/mL (ref 0.450–4.500)

## 2024-02-26 LAB — CMP14+EGFR
ALT: 16 IU/L (ref 0–32)
AST: 23 IU/L (ref 0–40)
Albumin: 4.2 g/dL (ref 3.8–4.8)
Alkaline Phosphatase: 97 IU/L (ref 44–121)
BUN/Creatinine Ratio: 19 (ref 12–28)
BUN: 18 mg/dL (ref 8–27)
Bilirubin Total: 1.1 mg/dL (ref 0.0–1.2)
CO2: 20 mmol/L (ref 20–29)
Calcium: 9.4 mg/dL (ref 8.7–10.3)
Chloride: 103 mmol/L (ref 96–106)
Creatinine, Ser: 0.93 mg/dL (ref 0.57–1.00)
Globulin, Total: 2.1 g/dL (ref 1.5–4.5)
Glucose: 84 mg/dL (ref 70–99)
Potassium: 4.7 mmol/L (ref 3.5–5.2)
Sodium: 141 mmol/L (ref 134–144)
Total Protein: 6.3 g/dL (ref 6.0–8.5)
eGFR: 65 mL/min/1.73 (ref >59)

## 2024-02-26 LAB — LIPID PANEL
Chol/HDL Ratio: 1.7 ratio (ref 0.0–4.4)
Cholesterol, Total: 180 mg/dL (ref 100–199)
HDL: 108 mg/dL (ref >39)
LDL Chol Calc (NIH): 59 mg/dL (ref 0–99)
Triglycerides: 69 mg/dL (ref 0–149)
VLDL Cholesterol Cal: 13 mg/dL (ref 5–40)

## 2024-02-26 LAB — CBC WITH DIFFERENTIAL/PLATELET
Basophils Absolute: 0 x10E3/uL (ref 0.0–0.2)
Basos: 1 %
EOS (ABSOLUTE): 0.2 x10E3/uL (ref 0.0–0.4)
Eos: 5 %
Hematocrit: 44.4 % (ref 34.0–46.6)
Hemoglobin: 14.3 g/dL (ref 11.1–15.9)
Immature Grans (Abs): 0 x10E3/uL (ref 0.0–0.1)
Immature Granulocytes: 0 %
Lymphocytes Absolute: 1.5 x10E3/uL (ref 0.7–3.1)
Lymphs: 41 %
MCH: 31.8 pg (ref 26.6–33.0)
MCHC: 32.2 g/dL (ref 31.5–35.7)
MCV: 99 fL — ABNORMAL HIGH (ref 79–97)
Monocytes Absolute: 0.4 x10E3/uL (ref 0.1–0.9)
Monocytes: 10 %
Neutrophils Absolute: 1.6 x10E3/uL (ref 1.4–7.0)
Neutrophils: 43 %
Platelets: 280 x10E3/uL (ref 150–450)
RBC: 4.49 x10E6/uL (ref 3.77–5.28)
RDW: 12 % (ref 11.7–15.4)
WBC: 3.7 x10E3/uL (ref 3.4–10.8)

## 2024-02-26 LAB — VITAMIN D 25 HYDROXY (VIT D DEFICIENCY, FRACTURES): Vit D, 25-Hydroxy: 44.5 ng/mL (ref 30.0–100.0)

## 2024-02-26 MED ORDER — LEVOTHYROXINE SODIUM 75 MCG PO TABS: 75.0000 ug | ORAL_TABLET | Freq: Every day | ORAL | 4 refills | Status: AC

## 2024-02-26 NOTE — Progress Notes (Signed)
 Tammy Humphrey,   Cholesterol looks GREAT. Much better than from one year ago.  Vitamin D  in normal range.  Thyroid  looks good. Refills sent of levothyroxine .  Continue same dose of atrovastatin.  Kidney and liver look good.   Great labs.

## 2024-03-04 ENCOUNTER — Encounter: Payer: Self-pay | Admitting: Obstetrics and Gynecology

## 2024-04-02 DIAGNOSIS — Z129 Encounter for screening for malignant neoplasm, site unspecified: Secondary | ICD-10-CM | POA: Diagnosis not present

## 2024-04-02 DIAGNOSIS — D0362 Melanoma in situ of left upper limb, including shoulder: Secondary | ICD-10-CM | POA: Diagnosis not present

## 2024-04-02 DIAGNOSIS — D225 Melanocytic nevi of trunk: Secondary | ICD-10-CM | POA: Diagnosis not present

## 2024-04-02 DIAGNOSIS — L570 Actinic keratosis: Secondary | ICD-10-CM | POA: Diagnosis not present

## 2024-04-02 DIAGNOSIS — X32XXXS Exposure to sunlight, sequela: Secondary | ICD-10-CM | POA: Diagnosis not present

## 2024-04-02 DIAGNOSIS — D485 Neoplasm of uncertain behavior of skin: Secondary | ICD-10-CM | POA: Diagnosis not present

## 2024-04-02 DIAGNOSIS — Z85828 Personal history of other malignant neoplasm of skin: Secondary | ICD-10-CM | POA: Diagnosis not present

## 2024-04-02 DIAGNOSIS — D2271 Melanocytic nevi of right lower limb, including hip: Secondary | ICD-10-CM | POA: Diagnosis not present

## 2024-04-02 DIAGNOSIS — D235 Other benign neoplasm of skin of trunk: Secondary | ICD-10-CM | POA: Diagnosis not present

## 2024-04-02 DIAGNOSIS — Z85820 Personal history of malignant melanoma of skin: Secondary | ICD-10-CM | POA: Diagnosis not present

## 2024-04-02 DIAGNOSIS — D1722 Benign lipomatous neoplasm of skin and subcutaneous tissue of left arm: Secondary | ICD-10-CM | POA: Diagnosis not present

## 2024-04-02 DIAGNOSIS — D2272 Melanocytic nevi of left lower limb, including hip: Secondary | ICD-10-CM | POA: Diagnosis not present

## 2024-04-02 DIAGNOSIS — L814 Other melanin hyperpigmentation: Secondary | ICD-10-CM | POA: Diagnosis not present

## 2024-04-28 DIAGNOSIS — D0362 Melanoma in situ of left upper limb, including shoulder: Secondary | ICD-10-CM | POA: Diagnosis not present

## 2024-05-07 ENCOUNTER — Ambulatory Visit

## 2024-05-07 DIAGNOSIS — M810 Age-related osteoporosis without current pathological fracture: Secondary | ICD-10-CM

## 2024-05-07 DIAGNOSIS — Z0000 Encounter for general adult medical examination without abnormal findings: Secondary | ICD-10-CM

## 2024-05-12 NOTE — Progress Notes (Signed)
 Bone Density shows osteoporosis and some change since last bone density. Need to discuss options for medication to help improve bone strength. Make sure to stay on vitamin D  and calcium . Any weight bearing exercise can be very helpful for osteoporosis.

## 2024-05-14 DIAGNOSIS — H52203 Unspecified astigmatism, bilateral: Secondary | ICD-10-CM | POA: Diagnosis not present

## 2024-05-14 DIAGNOSIS — H40013 Open angle with borderline findings, low risk, bilateral: Secondary | ICD-10-CM | POA: Diagnosis not present

## 2024-05-14 DIAGNOSIS — H524 Presbyopia: Secondary | ICD-10-CM | POA: Diagnosis not present

## 2024-06-03 ENCOUNTER — Ambulatory Visit
Admission: RE | Admit: 2024-06-03 | Discharge: 2024-06-03 | Disposition: A | Discharge status: Home / Self Care | Source: Home / Self Care | Attending: Family Medicine | Admitting: Family Medicine

## 2024-06-03 VITALS — BP 115/71 | HR 81 | Temp 99.3°F | Resp 17

## 2024-06-03 DIAGNOSIS — M5441 Lumbago with sciatica, right side: Secondary | ICD-10-CM

## 2024-06-03 MED ORDER — METHYLPREDNISOLONE ACETATE 80 MG/ML IJ SUSP
80.0000 mg | Freq: Once | INTRAMUSCULAR | Status: AC
  Administered 2024-06-03: 12:00:00 80 mg via INTRAMUSCULAR

## 2024-06-03 MED ORDER — PREDNISONE 10 MG (21) PO TBPK: ORAL_TABLET | Freq: Every day | ORAL | 0 refills | Status: AC

## 2024-06-03 NOTE — ED Triage Notes (Signed)
 Pt c/o RT buttocks pain x 2 weeks. Worsening in last couple days. Denies injury. Hx of sciatica.  Tylenol  prn with no relief.

## 2024-06-03 NOTE — Discharge Instructions (Addendum)
 Instructed patient to start prednisone  taper tomorrow Wednesday, 06/04/2024.  Advised patient take medication as directed with food to completion.  Encouraged increase daily water intake to 64 ounces per day while taking this medication.  Advised if symptoms worsen and/or unresolved please follow-up with your PCP or here for further evaluation.

## 2024-06-03 NOTE — ED Provider Notes (Signed)
 [name]    CSN: [id number] Arrival date & time: 06/03/24  0948      History   Chief Complaint Chief Complaint  Patient presents with   Back Pain    RT buttocks, APPT 10AM    HPI Tammy Humphrey is a 73 y.o. female.   HPI Very pleasant 73 year old female presents with right buttocks pain for 2 weeks.  Reports history of sciatica.  PMH significant for IDA, H LD, and osteoporosis.  Past Medical History:  Diagnosis Date   GERD (gastroesophageal reflux disease)    HLD (hyperlipidemia)    Hypothyroidism    Osteopenia    Status post dilation of esophageal narrowing     Patient Active Problem List   Diagnosis Date Noted   Melanoma of skin (HCC) 02/25/2024   Arthralgia 03/16/2020   Hamstring muscle strain 03/16/2020   Neck pain 03/16/2020   Skin abrasion 03/04/2018   Hyperlipidemia 08/07/2014   IDA (iron deficiency anemia) 08/07/2014   Esophageal reflux 08/03/2014   Thyroid  activity decreased 08/03/2014   Osteoporosis 08/03/2014    Past Surgical History:  Procedure Laterality Date   CESAREAN SECTION     x 2    OB History   No obstetric history on file.      Home Medications    Prior to Admission medications  Medication Sig Start Date End Date Taking? Authorizing Provider  predniSONE  (STERAPRED UNI-PAK 21 TAB) 10 MG (21) TBPK tablet Take by mouth daily. Take 6 tabs by mouth daily  for 2 days, then 5 tabs for 2 days, then 4 tabs for 2 days, then 3 tabs for 2 days, 2 tabs for 2 days, then 1 tab by mouth daily for 2 days 06/03/24  Yes Teddy Sharper, FNP  atorvastatin  (LIPITOR) 20 MG tablet Take 1 tablet (20 mg total) by mouth at bedtime. 02/25/24   Breeback, Jade L, PA-C  Calcium  600-200 MG-UNIT tablet Take 1 tablet by mouth 2 (two) times daily.    [provider]  levothyroxine  (SYNTHROID ) 75 MCG tablet Take 1 tablet (75 mcg total) by mouth daily before breakfast. 02/26/24   Antoniette Vermell CROME, PA-C    Family History Family History   Problem Relation Age of Onset   Hypertension Mother    Heart attack Father    Stroke Father    Diverticulitis Sister    Stomach cancer Maternal Grandfather    Heart attack Paternal Grandmother    Colon polyps Neg Hx    Rectal cancer Neg Hx    Esophageal cancer Neg Hx     Social History Social History[1]   Allergies   Prolia  [denosumab ]   Review of Systems Review of Systems  Musculoskeletal:  Positive for back pain.       Right sided lower back/right hip/right buttock pain believed to be sciatica by patient     Physical Exam Triage Vital Signs ED Triage Vitals  Encounter Vitals Group     BP      Girls Systolic BP Percentile      Girls Diastolic BP Percentile      Boys Systolic BP Percentile      Boys Diastolic BP Percentile      Pulse      Resp      Temp      Temp src      SpO2      Weight      Height      Head Circumference      Peak  Flow      Pain Score      Pain Loc      Pain Education      Exclude from Growth Chart    No data found.  Updated Vital Signs BP 115/71 (BP Location: Right Arm)   Pulse 81   Temp 99.3 F (37.4 C) (Oral)   Resp 17   SpO2 98%    Physical Exam Vitals and nursing note reviewed.  Constitutional:      Appearance: Normal appearance. She is normal weight.  HENT:     Head: Normocephalic and atraumatic.     Mouth/Throat:     Mouth: Mucous membranes are moist.     Pharynx: Oropharynx is clear.  Eyes:     Extraocular Movements: Extraocular movements intact.     Conjunctiva/sclera: Conjunctivae normal.     Pupils: Pupils are equal, round, and reactive to light.  Cardiovascular:     Rate and Rhythm: Normal rate and regular rhythm.     Heart sounds: Normal heart sounds.  Pulmonary:     Effort: Pulmonary effort is normal.     Breath sounds: Normal breath sounds. No wheezing, rhonchi or rales.  Musculoskeletal:        General: Normal range of motion.     Comments: Right sided lumbar spine (inferior aspect/right hip  (superior lateral aspect): TTP with radiating pain reported to upper anterior/posterior right leg  Skin:    General: Skin is warm and dry.  Neurological:     General: No focal deficit present.     Mental Status: She is alert and oriented to person, place, and time. Mental status is at baseline.      UC Treatments / Results  Labs (all labs ordered are listed, but only abnormal results are displayed) Labs Reviewed - No data to display  EKG   Radiology No results found.  Procedures Procedures (including critical care time)  Medications Ordered in UC Medications  methylPREDNISolone  acetate (DEPO-MEDROL ) injection 80 mg (80 mg Intramuscular Given 06/03/24 1131)    Initial Impression / Assessment and Plan / UC Course  I have reviewed the triage vital signs and the nursing notes.  Pertinent labs & imaging results that were available during my care of the patient were reviewed by me and considered in my medical decision making (see chart for details).     MDM: 1.  Acute midline low back pain with right-sided sciatica-IM Depo-Medrol  given once in clinic and prior to discharge, Rx'd Sterapred Unipak (42 tab 10 mg taper) take as directed. Instructed patient to start prednisone  taper tomorrow Wednesday, 06/04/2024.  Advised patient take medication as directed with food to completion.  Encouraged increase daily water intake to 64 ounces per day while taking this medication.  Advised if symptoms worsen and/or unresolved please follow-up with your PCP or here for further evaluation.  Patient discharged home, hemodynamically stable. Final Clinical Impressions(s) / UC Diagnoses   Final diagnoses:  Acute midline low back pain with right-sided sciatica     Discharge Instructions      Instructed patient to start prednisone  taper tomorrow Wednesday, 06/04/2024.  Advised patient take medication as directed with food to completion.  Encouraged increase daily water intake to 64 ounces per day  while taking this medication.  Advised if symptoms worsen and/or unresolved please follow-up with your PCP or here for further evaluation.     ED Prescriptions     Medication Sig Dispense Auth. Provider   predniSONE  (STERAPRED UNI-PAK 21 TAB)  10 MG (21) TBPK tablet Take by mouth daily. Take 6 tabs by mouth daily  for 2 days, then 5 tabs for 2 days, then 4 tabs for 2 days, then 3 tabs for 2 days, 2 tabs for 2 days, then 1 tab by mouth daily for 2 days 42 tablet Teddy Sharper, FNP      PDMP not reviewed this encounter.    [1]  Social History Tobacco Use   Smoking status: Never   Smokeless tobacco: Never  Vaping Use   Vaping status: Never Used  Substance Use Topics   Alcohol use: No    Alcohol/week: 0.0 standard drinks of alcohol   Drug use: No     Teddy Sharper, FNP 06/03/24 1154

## 2024-08-04 ENCOUNTER — Ambulatory Visit: Payer: PPO

## 2024-08-07 ENCOUNTER — Encounter
